# Patient Record
Sex: Male | Born: 1975 | Race: Black or African American | Hispanic: No | Marital: Single | Smoking: Former smoker
Health system: Southern US, Community
[De-identification: ages and names within clinical notes are randomized; demographics above are authoritative.]

## PROBLEM LIST (undated history)

## (undated) DIAGNOSIS — I10 Essential (primary) hypertension: Secondary | ICD-10-CM

## (undated) DIAGNOSIS — I471 Supraventricular tachycardia, unspecified: Secondary | ICD-10-CM

## (undated) DIAGNOSIS — K219 Gastro-esophageal reflux disease without esophagitis: Secondary | ICD-10-CM

## (undated) DIAGNOSIS — E785 Hyperlipidemia, unspecified: Secondary | ICD-10-CM

## (undated) DIAGNOSIS — T7840XA Allergy, unspecified, initial encounter: Secondary | ICD-10-CM

## (undated) DIAGNOSIS — N529 Male erectile dysfunction, unspecified: Secondary | ICD-10-CM

## (undated) HISTORY — PX: NO PAST SURGERIES: SHX2092

## (undated) HISTORY — DX: Allergy, unspecified, initial encounter: T78.40XA

## (undated) HISTORY — DX: Gastro-esophageal reflux disease without esophagitis: K21.9

## (undated) HISTORY — DX: Essential (primary) hypertension: I10

## (undated) HISTORY — DX: Hyperlipidemia, unspecified: E78.5

## (undated) HISTORY — DX: Male erectile dysfunction, unspecified: N52.9

---

## 2004-03-21 ENCOUNTER — Ambulatory Visit (HOSPITAL_COMMUNITY): Admission: RE | Admit: 2004-03-21 | Discharge: 2004-03-21 | Payer: Self-pay

## 2005-10-19 ENCOUNTER — Emergency Department (HOSPITAL_COMMUNITY): Admission: EM | Admit: 2005-10-19 | Discharge: 2005-10-19 | Payer: Self-pay | Admitting: Emergency Medicine

## 2007-08-09 ENCOUNTER — Emergency Department (HOSPITAL_COMMUNITY): Admission: EM | Admit: 2007-08-09 | Discharge: 2007-08-09 | Payer: Self-pay | Admitting: Emergency Medicine

## 2010-10-12 LAB — URINALYSIS, ROUTINE W REFLEX MICROSCOPIC
Glucose, UA: NEGATIVE
Ketones, ur: NEGATIVE
Leukocytes, UA: NEGATIVE
Nitrite: NEGATIVE
pH: 5.5

## 2010-10-12 LAB — DIFFERENTIAL
Basophils Absolute: 0.1
Lymphocytes Relative: 39
Lymphs Abs: 2.5
Monocytes Relative: 9
Neutro Abs: 3.1
Neutrophils Relative %: 50

## 2010-10-12 LAB — CBC
Hemoglobin: 14.6
MCHC: 33.3
RBC: 5.18
RDW: 13.2

## 2010-10-12 LAB — COMPREHENSIVE METABOLIC PANEL
AST: 18
Alkaline Phosphatase: 58
Chloride: 109
GFR calc non Af Amer: >60
Sodium: 139
Total Bilirubin: 0.9

## 2010-10-12 LAB — URINE MICROSCOPIC-ADD ON

## 2010-10-12 LAB — LIPASE, BLOOD: Lipase: 41

## 2010-10-12 LAB — RPR: RPR Ser Ql: NONREACTIVE

## 2012-11-17 ENCOUNTER — Encounter: Payer: Self-pay | Admitting: Family Medicine

## 2012-11-17 ENCOUNTER — Ambulatory Visit (INDEPENDENT_AMBULATORY_CARE_PROVIDER_SITE_OTHER): Payer: BC Managed Care – PPO | Admitting: Family Medicine

## 2012-11-17 VITALS — BP 112/77 | HR 92 | Temp 98.2°F | Wt 200.0 lb

## 2012-11-17 DIAGNOSIS — R21 Rash and other nonspecific skin eruption: Secondary | ICD-10-CM

## 2012-11-17 DIAGNOSIS — J029 Acute pharyngitis, unspecified: Secondary | ICD-10-CM

## 2012-11-17 LAB — POCT INFLUENZA A/B
Influenza A, POC: NEGATIVE
Influenza B, POC: NEGATIVE

## 2012-11-17 LAB — POCT RAPID STREP A (OFFICE): Rapid Strep A Screen: NEGATIVE

## 2012-11-17 MED ORDER — KETOCONAZOLE 2 % EX CREA: 1.0000 "application " | TOPICAL_CREAM | Freq: Two times a day (BID) | CUTANEOUS | Status: DC

## 2012-11-17 MED ORDER — FLUCONAZOLE 150 MG PO TABS: 150.0000 mg | ORAL_TABLET | Freq: Every day | ORAL | Status: DC

## 2012-11-17 MED ORDER — LEVOFLOXACIN 500 MG PO TABS: 500.0000 mg | ORAL_TABLET | Freq: Every day | ORAL | Status: DC

## 2012-11-17 NOTE — Addendum Note (Signed)
Addended by: Deatra Canter on: 11/17/2012 12:43 PM   Modules accepted: Orders

## 2012-11-17 NOTE — Addendum Note (Signed)
Addended by: Gwenith Daily on: 11/17/2012 12:23 PM   Modules accepted: Orders

## 2012-11-17 NOTE — Progress Notes (Signed)
  Subjective:    Patient ID: James Snyder, male    DOB: 1975/07/10, 37 y.o.   MRN: [id number]  HPI This 37 y.o. male presents for evaluation of rash on the left side of his face which went Away when he used his brothers cream but it is now coming back. He has sore throat Ear disomfort and uri sx's..   Review of Systems C/o uri sx's. No chest pain, SOB, HA, dizziness, vision change, N/V, diarrhea, constipation, dysuria, urinary urgency or frequency, myalgias, arthralgias or rash.     Objective:   Physical Exam Vital signs noted  Well developed well nourished male.  HEENT - Head atraumatic Normocephalic                Eyes - PERRLA, Conjuctiva - clear Sclera- Clear EOMI                Ears - EAC's Wnl TM's Wnl Gross Hearing WNL                Nose - Nares patent                 Throat - oropharanx injected. Respiratory - Lungs CTA bilateral Cardiac - RRR S1 and S2 without murmur GI - Abdomen soft Nontender and bowel sounds active x 4 Extremities - No edema. Neuro - Grossly intact.       Assessment & Plan:  Acute pharyngitis - Plan: POCT rapid strep A, POCT Influenza A/B WSWG's, tylneol and motrin otc as directed, push po fluids,rest.

## 2014-04-28 ENCOUNTER — Encounter: Payer: Self-pay | Admitting: Physician Assistant

## 2014-04-28 ENCOUNTER — Ambulatory Visit (INDEPENDENT_AMBULATORY_CARE_PROVIDER_SITE_OTHER): Payer: BLUE CROSS/BLUE SHIELD | Admitting: Physician Assistant

## 2014-04-28 VITALS — BP 153/103 | HR 101 | Temp 99.0°F | Ht 69.0 in | Wt 200.0 lb

## 2014-04-28 DIAGNOSIS — J0100 Acute maxillary sinusitis, unspecified: Secondary | ICD-10-CM | POA: Diagnosis not present

## 2014-04-28 DIAGNOSIS — I10 Essential (primary) hypertension: Secondary | ICD-10-CM | POA: Diagnosis not present

## 2014-04-28 DIAGNOSIS — J309 Allergic rhinitis, unspecified: Secondary | ICD-10-CM

## 2014-04-28 DIAGNOSIS — K219 Gastro-esophageal reflux disease without esophagitis: Secondary | ICD-10-CM

## 2014-04-28 DIAGNOSIS — B9689 Other specified bacterial agents as the cause of diseases classified elsewhere: Secondary | ICD-10-CM

## 2014-04-28 DIAGNOSIS — J302 Other seasonal allergic rhinitis: Secondary | ICD-10-CM

## 2014-04-28 DIAGNOSIS — J019 Acute sinusitis, unspecified: Principal | ICD-10-CM

## 2014-04-28 DIAGNOSIS — Z8639 Personal history of other endocrine, nutritional and metabolic disease: Secondary | ICD-10-CM

## 2014-04-28 LAB — POCT CBC
GRANULOCYTE PERCENT: 65.6 % (ref 37–80)
HEMATOCRIT: 48.5 % (ref 43.5–53.7)
Hemoglobin: 14.9 g/dL (ref 14.1–18.1)
LYMPH, POC: 3 (ref 0.6–3.4)
MCH: 25.4 pg — AB (ref 27–31.2)
MCHC: 30.8 g/dL — AB (ref 31.8–35.4)
MCV: 82.7 fL (ref 80–97)
MPV: 7.2 fL (ref 0–99.8)
PLATELET COUNT, POC: 313 10*3/uL (ref 142–424)
POC Granulocyte: 6.1 (ref 2–6.9)
POC LYMPH PERCENT: 32.7 %L (ref 10–50)
RBC: 5.87 M/uL (ref 4.69–6.13)
RDW, POC: 14.2 %
WBC: 9.3 10*3/uL (ref 4.6–10.2)

## 2014-04-28 MED ORDER — PRILOSEC 20 MG PO CPDR
20.0000 mg | DELAYED_RELEASE_CAPSULE | Freq: Every day | ORAL | Status: DC
Start: 2014-04-28 — End: 2014-04-28

## 2014-04-28 MED ORDER — CETIRIZINE HCL 10 MG PO TABS: 10.0000 mg | ORAL_TABLET | Freq: Every day | ORAL | Status: DC

## 2014-04-28 MED ORDER — OMEPRAZOLE 40 MG PO CPDR: 40.0000 mg | DELAYED_RELEASE_CAPSULE | Freq: Every day | ORAL | Status: DC

## 2014-04-28 MED ORDER — AZITHROMYCIN 250 MG PO TABS: ORAL_TABLET | ORAL | Status: DC

## 2014-04-28 MED ORDER — FLUTICASONE PROPIONATE 50 MCG/ACT NA SUSP: 2.0000 | Freq: Every day | NASAL | Status: DC

## 2014-04-28 NOTE — Progress Notes (Signed)
   Subjective:    Patient ID: James Snyder, male    DOB: 01/14/1976, 39 y.o.   MRN: [id number]  HPI 39 y/o male presents with c/o sore throat, cough, nasal congestion x 2 days. Cough worse at night. Has not tried any medications.     Review of Systems  Constitutional: Positive for chills and diaphoresis. Negative for fever.  HENT: Positive for congestion (nasal ), postnasal drip, rhinorrhea, sinus pressure, sneezing and sore throat.   Eyes: Positive for itching.  Respiratory: Positive for cough (productive ). Negative for chest tightness and wheezing.        Objective:   Physical Exam  Constitutional: He appears well-developed and well-nourished. No distress.  HENT:  Head: Normocephalic.  Right Ear: External ear normal.  Left Ear: External ear normal.  Mouth/Throat: Oropharynx is clear and moist. No oropharyngeal exudate.  TTP bilateral maxillary sinuses  Neck: Normal range of motion. Neck supple.  Cardiovascular: Normal rate, regular rhythm and normal heart sounds.  Exam reveals no gallop and no friction rub.   No murmur heard. Hypertensive Mild tachycardic  Pulmonary/Chest: Effort normal and breath sounds normal. No respiratory distress. He has no wheezes. He has no rales. He exhibits no tenderness.  Skin: He is not diaphoretic.  Nursing note and vitals reviewed.         Assessment & Plan:  1. Acute bacterial sinusitis  - azithromycin (ZITHROMAX) 250 MG tablet; Take 2 pills on day 1. Then 1 pill on day 2-5  Dispense: 6 tablet; Refill: 0     2. Gastroesophageal reflux disease without esophagitis  - omeprazole (PRILOSEC) 40 MG capsule; Take 1 capsule (40 mg total) by mouth daily.  Dispense: 30 capsule; Refill: 3  3. Allergic rhinitis, unspecified allergic rhinitis type  - fluticasone (FLONASE) 50 MCG/ACT nasal spray; Place 2 sprays into both nostrils daily.  Dispense: 16 g; Refill: 6 - cetirizine (ZYRTEC) 10 MG tablet; Take 1 tablet (10 mg total) by mouth daily.   Dispense: 30 tablet; Refill: 11  4. History of hyperlipidemia  - Lipid Panel  5. Essential hypertension  - Basic Metabolic Panel - POCT CBC     Continue all meds Labs pending Health Maintenance reviewed Diet and exercise encouraged

## 2014-04-29 ENCOUNTER — Telehealth: Payer: Self-pay | Admitting: *Deleted

## 2014-04-29 DIAGNOSIS — J0100 Acute maxillary sinusitis, unspecified: Secondary | ICD-10-CM | POA: Insufficient documentation

## 2014-04-29 LAB — LIPID PANEL
CHOL/HDL RATIO: 6.8 ratio — AB (ref 0.0–5.0)
Cholesterol, Total: 286 mg/dL — ABNORMAL HIGH (ref 100–199)
HDL: 42 mg/dL (ref >39)
LDL Calculated: 213 mg/dL — ABNORMAL HIGH (ref 0–99)
Triglycerides: 154 mg/dL — ABNORMAL HIGH (ref 0–149)
VLDL Cholesterol Cal: 31 mg/dL (ref 5–40)

## 2014-04-29 LAB — BASIC METABOLIC PANEL
BUN/Creatinine Ratio: 9 (ref 9–20)
BUN: 8 mg/dL (ref 6–24)
CHLORIDE: 101 mmol/L (ref 97–108)
CO2: 22 mmol/L (ref 18–29)
CREATININE: 0.94 mg/dL (ref 0.76–1.27)
Calcium: 9.5 mg/dL (ref 8.7–10.2)
GFR, EST AFRICAN AMERICAN: 117 mL/min/{1.73_m2} (ref >59)
GFR, EST NON AFRICAN AMERICAN: 101 mL/min/{1.73_m2} (ref >59)
GLUCOSE: 95 mg/dL (ref 65–99)
POTASSIUM: 4.6 mmol/L (ref 3.5–5.2)
SODIUM: 139 mmol/L (ref 134–144)

## 2014-04-29 NOTE — Telephone Encounter (Signed)
-----   Message from Chelsea Primusiffany A Gann, PA-C sent at 04/29/2014 11:34 AM EDT ----- Have patient f/u in 2 weeks to discuss labs, starting medication and recheck sinusitis.

## 2014-04-29 NOTE — Telephone Encounter (Signed)
ttc pt regarding test results, couldn't leave message.

## 2014-05-25 ENCOUNTER — Ambulatory Visit (INDEPENDENT_AMBULATORY_CARE_PROVIDER_SITE_OTHER): Payer: BLUE CROSS/BLUE SHIELD | Admitting: Physician Assistant

## 2014-05-25 ENCOUNTER — Encounter: Payer: Self-pay | Admitting: Physician Assistant

## 2014-05-25 VITALS — BP 137/91 | HR 85 | Temp 97.7°F | Ht 69.0 in | Wt 205.0 lb

## 2014-05-25 DIAGNOSIS — R350 Frequency of micturition: Secondary | ICD-10-CM | POA: Diagnosis not present

## 2014-05-25 DIAGNOSIS — R42 Dizziness and giddiness: Secondary | ICD-10-CM

## 2014-05-25 DIAGNOSIS — I10 Essential (primary) hypertension: Secondary | ICD-10-CM

## 2014-05-25 DIAGNOSIS — L749 Eccrine sweat disorder, unspecified: Secondary | ICD-10-CM

## 2014-05-25 DIAGNOSIS — E785 Hyperlipidemia, unspecified: Secondary | ICD-10-CM | POA: Diagnosis not present

## 2014-05-25 LAB — POCT UA - MICROSCOPIC ONLY
BACTERIA, U MICROSCOPIC: NEGATIVE
CRYSTALS, UR, HPF, POC: NEGATIVE
Casts, Ur, LPF, POC: NEGATIVE
EPITHELIAL CELLS, URINE PER MICROSCOPY: NEGATIVE
Yeast, UA: NEGATIVE

## 2014-05-25 LAB — POCT URINALYSIS DIPSTICK
Glucose, UA: NEGATIVE
KETONES UA: NEGATIVE
Leukocytes, UA: NEGATIVE
Nitrite, UA: NEGATIVE
SPEC GRAV UA: 1.025
Urobilinogen, UA: NEGATIVE
pH, UA: 6

## 2014-05-25 MED ORDER — SIMVASTATIN 20 MG PO TABS: 20.0000 mg | ORAL_TABLET | Freq: Every day | ORAL | Status: DC

## 2014-05-25 MED ORDER — LISINOPRIL-HYDROCHLOROTHIAZIDE 10-12.5 MG PO TABS: 1.0000 | ORAL_TABLET | Freq: Every day | ORAL | Status: DC

## 2014-05-25 NOTE — Progress Notes (Signed)
   Subjective:    Patient ID: James Snyder, male    DOB: 01/12/1976, 39 y.o.   MRN: [id number]  HPI 39 y/o male presents for review of labs that were checked at his visit in April. He has a history of hyperlipidemia but stopped taking his cholesterol medication 8 years ago. He also states that over the past two years he has had episodes of sweating when he's just sitting. Not always related to exercise. Has also had episodes of central chest pain, which is relieved by Pepsi and belching. Intermittent episodes of dizziness that started yesterday.     Review of Systems  Constitutional: Positive for diaphoresis. Negative for chills and fatigue.  Eyes: Negative.   Respiratory: Negative for shortness of breath.   Cardiovascular: Positive for chest pain (occasional, relieved by drinking Pepsi and belching ). Negative for palpitations and leg swelling.  Gastrointestinal: Negative.   Endocrine: Positive for heat intolerance and polyuria.  Genitourinary: Positive for frequency.  Neurological: Positive for dizziness and headaches.       Objective:   Physical Exam  Constitutional: He is oriented to person, place, and time. He appears well-developed and well-nourished. No distress.  HENT:  Head: Normocephalic and atraumatic.  Right Ear: External ear normal.  Left Ear: External ear normal.  Cardiovascular: Normal rate, regular rhythm and normal heart sounds.  Exam reveals no gallop and no friction rub.   No murmur heard. Pulmonary/Chest: Effort normal and breath sounds normal.  Abdominal: Soft. Bowel sounds are normal.  Neurological: He is alert and oriented to person, place, and time.  Skin: He is not diaphoretic.  Psychiatric: He has a normal mood and affect. His behavior is normal. Judgment and thought content normal.  Nursing note and vitals reviewed.         Assessment & Plan:  1. Sweating abnormality  - Testosterone,Free and Total - Thyroid Panel With TSH  2. Dizziness and  giddiness  - EKG 12-Lead  3. Urine frequency  - POCT UA - Microscopic Only - POCT urinalysis dipstick  4. Hyperlipidemia  - simvastatin (ZOCOR) 20 MG tablet; Take 1 tablet (20 mg total) by mouth at bedtime.  Dispense: 30 tablet; Refill: 5  5. Essential hypertension  - lisinopril-hydrochlorothiazide (PRINZIDE,ZESTORETIC) 10-12.5 MG per tablet; Take 1 tablet by mouth daily.  Dispense: 30 tablet; Refill: 0   Continue all meds Labs pending Health Maintenance reviewed Diet and exercise encouraged RTO 3 months   Clemens Lachman A. Chauncey ReadingGann PA-C

## 2014-05-28 LAB — TESTOSTERONE,FREE AND TOTAL
Testosterone, Free: 8.9 pg/mL (ref 6.8–21.5)
Testosterone: 451 ng/dL (ref 348–1197)

## 2014-05-28 LAB — THYROID PANEL WITH TSH
Free Thyroxine Index: 1.8 (ref 1.2–4.9)
T3 Uptake Ratio: 33 % (ref 24–39)
T4, Total: 5.6 ug/dL (ref 4.5–12.0)
TSH: 0.733 u[IU]/mL (ref 0.450–4.500)

## 2014-06-24 ENCOUNTER — Other Ambulatory Visit: Payer: Self-pay | Admitting: Physician Assistant

## 2014-06-24 ENCOUNTER — Telehealth: Payer: Self-pay | Admitting: Physician Assistant

## 2014-06-24 NOTE — Telephone Encounter (Signed)
LMTCB

## 2014-06-24 NOTE — Telephone Encounter (Signed)
He has not been seen for this in our office. He needs to set up appt to discuss. Viagra and Cialis can both cause headaches. Undray Allman A. Chauncey Reading PA-C

## 2014-06-24 NOTE — Telephone Encounter (Signed)
Wants to know if you will rx cialis to use PRN. He has heard that Viagra causes Headaches - do you know anything about this?

## 2014-06-28 ENCOUNTER — Ambulatory Visit (INDEPENDENT_AMBULATORY_CARE_PROVIDER_SITE_OTHER): Payer: BLUE CROSS/BLUE SHIELD | Admitting: Physician Assistant

## 2014-06-28 ENCOUNTER — Encounter (INDEPENDENT_AMBULATORY_CARE_PROVIDER_SITE_OTHER): Payer: Self-pay

## 2014-06-28 ENCOUNTER — Encounter: Payer: Self-pay | Admitting: Physician Assistant

## 2014-06-28 VITALS — BP 120/83 | HR 102 | Temp 97.6°F | Ht 69.0 in | Wt 198.0 lb

## 2014-06-28 DIAGNOSIS — N529 Male erectile dysfunction, unspecified: Secondary | ICD-10-CM

## 2014-06-28 MED ORDER — SILDENAFIL CITRATE 50 MG PO TABS: 50.0000 mg | ORAL_TABLET | Freq: Every day | ORAL | Status: DC | PRN

## 2014-06-28 NOTE — Progress Notes (Signed)
   Subjective:    Patient ID: James Snyder, male    DOB: 1975-09-07, 38 y.o.   MRN: [id number]  HPI 39 y/o male with comorbid HTN and hyperlipidemia presents for discussion of ED. He states that he can get an erection but has problems keeping it and this is occuring more frequently. He has tried Viagra in the past with success.     Review of Systems  Endocrine: Negative.   Genitourinary:       Worsening Inability to maintain an erection  All other systems reviewed and are negative.      Objective:   Physical Exam  Constitutional: He is oriented to person, place, and time. He appears well-developed and well-nourished. No distress.  Cardiovascular: Normal rate, regular rhythm and normal heart sounds.  Exam reveals no gallop and no friction rub.   No murmur heard. Pulmonary/Chest: Effort normal and breath sounds normal.  Neurological: He is alert and oriented to person, place, and time.  Skin: He is not diaphoretic.  Psychiatric: He has a normal mood and affect. His behavior is normal. Judgment and thought content normal.  Nursing note and vitals reviewed.         Assessment & Plan:  1. Erectile dysfunction, unspecified erectile dysfunction type  - sildenafil (VIAGRA) 50 MG tablet; Take 1 tablet (50 mg total) by mouth daily as needed for erectile dysfunction.  Dispense: 15 tablet; Refill: 0   RTC prn   Tiffany A. Chauncey Reading PA-C

## 2014-06-28 NOTE — Patient Instructions (Signed)
Sildenafil tablets (Viagra) What is this medicine? SILDENAFIL (sil DEN a fil) is used to treat erection problems in men. This medicine may be used for other purposes; ask your health care provider or pharmacist if you have questions. COMMON BRAND NAME(S): Viagra What should I tell my health care provider before I take this medicine? They need to know if you have any of these conditions: -bleeding disorders -eye or vision problems, including a rare inherited eye disease called retinitis pigmentosa -anatomical deformation of the penis, Peyronie's disease, or history of priapism (painful and prolonged erection) -heart disease, angina, a history of heart attack, irregular heart beats, or other heart problems -high or low blood pressure -history of blood diseases, like sickle cell anemia or leukemia -history of stomach bleeding -kidney disease -liver disease -stroke -an unusual or allergic reaction to sildenafil, other medicines, foods, dyes, or preservatives -pregnant or trying to get pregnant -breast-feeding How should I use this medicine? Take this medicine by mouth with a glass of water. Follow the directions on the prescription label. The dose is usually taken 1 hour before sexual activity. You should not take the dose more than once per day. Do not take your medicine more often than directed. Talk to your pediatrician regarding the use of this medicine in children. This medicine is not used in children for this condition. Overdosage: If you think you have taken too much of this medicine contact a poison control center or emergency room at once. NOTE: This medicine is only for you. Do not share this medicine with others. What if I miss a dose? This does not apply. Do not take double or extra doses. What may interact with this medicine? Do not take this medicine with any of the following medications: -cisapride -methscopolamine nitrate -nitrates like amyl nitrite, isosorbide dinitrate,  isosorbide mononitrate, nitroglycerin -nitroprusside -other medicines for erectile dysfunction like avanafil, tadalafil, vardenafil -other sildenafil products (Revatio) This medicine may also interact with the following medications: -certain drugs for high blood pressure -certain drugs for the treatment of HIV infection or AIDS -certain drugs used for fungal or yeast infections, like fluconazole, itraconazole, ketoconazole, and voriconazole -cimetidine -erythromycin -rifampin This list may not describe all possible interactions. Give your health care provider a list of all the medicines, herbs, non-prescription drugs, or dietary supplements you use. Also tell them if you smoke, drink alcohol, or use illegal drugs. Some items may interact with your medicine. What should I watch for while using this medicine? If you notice any changes in your vision while taking this drug, call your doctor or health care professional as soon as possible. Stop using this medicine and call your health care provider right away if you have a loss of sight in one or both eyes. Contact your doctor or health care professional right away if you have an erection that lasts longer than 4 hours or if it becomes painful. This may be a sign of a serious problem and must be treated right away to prevent permanent damage. If you experience symptoms of nausea, dizziness, chest pain or arm pain upon initiation of sexual activity after taking this medicine, you should refrain from further activity and call your doctor or health care professional as soon as possible. Do not drink alcohol to excess (examples, 5 glasses of wine or 5 shots of whiskey) when taking this medicine. When taken in excess, alcohol can increase your chances of getting a headache or getting dizzy, increasing your heart rate or lowering your blood pressure.  Using this medicine does not protect you or your partner against HIV infection (the virus that causes AIDS) or  other sexually transmitted diseases. What side effects may I notice from receiving this medicine? Side effects that you should report to your doctor or health care professional as soon as possible: -allergic reactions like skin rash, itching or hives, swelling of the face, lips, or tongue -breathing problems -changes in hearing -changes in vision -chest pain -fast, irregular heartbeat -prolonged or painful erection -seizures Side effects that usually do not require medical attention (report to your doctor or health care professional if they continue or are bothersome): -back pain -dizziness -flushing -headache -indigestion -muscle aches -nausea -stuffy or runny nose This list may not describe all possible side effects. Call your doctor for medical advice about side effects. You may report side effects to FDA at 1-800-FDA-1088. Where should I keep my medicine? Keep out of reach of children. Store at room temperature between 15 and 30 degrees C (59 and 86 degrees F). Throw away any unused medicine after the expiration date. NOTE: This sheet is a summary. It may not cover all possible information. If you have questions about this medicine, talk to your doctor, pharmacist, or health care provider.  2015, Elsevier/Gold Standard. (2012-01-01 12:43:54) Erectile Dysfunction Erectile dysfunction is the inability to get or sustain a good enough erection to have sexual intercourse. Erectile dysfunction may involve:  Inability to get an erection.  Lack of enough hardness to allow penetration.  Loss of the erection before sex is finished.  Premature ejaculation. CAUSES  Certain drugs, such as:  Pain relievers.  Antihistamines.  Antidepressants.  Blood pressure medicines.  Water pills (diuretics).  Ulcer medicines.  Muscle relaxants.  Illegal drugs.  Excessive drinking.  Psychological causes, such as:  Anxiety.  Depression.  Sadness.  Exhaustion.  Performance  fear.  Stress.  Physical causes, such as:  Artery problems. This may include diabetes, smoking, liver disease, or atherosclerosis.  High blood pressure.  Hormonal problems, such as low testosterone.  Obesity.  Nerve problems. This may include back or pelvic injuries, diabetes mellitus, multiple sclerosis, or Parkinson disease. SYMPTOMS  Inability to get an erection.  Lack of enough hardness to allow penetration.  Loss of the erection before sex is finished.  Premature ejaculation.  Normal erections at some times, but with frequent unsatisfactory episodes.  Orgasms that are not satisfactory in sensation or frequency.  Low sexual satisfaction in either partner because of erection problems.  A curved penis occurring with erection. The curve may cause pain or may be too curved to allow for intercourse.  Never having nighttime erections. DIAGNOSIS Your caregiver can often diagnose this condition by:  Performing a physical exam to find other diseases or specific problems with the penis.  Asking you detailed questions about the problem.  Performing blood tests to check for diabetes mellitus or to measure hormone levels.  Performing urine tests to find other underlying health conditions.  Performing an ultrasound exam to check for scarring.  Performing a test to check blood flow to the penis.  Doing a sleep study at home to measure nighttime erections. TREATMENT   You may be prescribed medicines by mouth.  You may be given medicine injections into the penis.  You may be prescribed a vacuum pump with a ring.  Penile implant surgery may be performed. You may receive:  An inflatable implant.  A semirigid implant.  Blood vessel surgery may be performed. HOME CARE INSTRUCTIONS  If you  are prescribed oral medicine, you should take the medicine as prescribed. Do not increase the dosage without first discussing it with your physician.  If you are using  self-injections, be careful to avoid any veins that are on the surface of the penis. Apply pressure to the injection site for 5 minutes.  If you are using a vacuum pump, make sure you have read the instructions before using it. Discuss any questions with your physician before taking the pump home. SEEK MEDICAL CARE IF:  You experience pain that is not responsive to the pain medicine you have been prescribed.  You experience nausea or vomiting. SEEK IMMEDIATE MEDICAL CARE IF:   When taking oral or injectable medications, you experience an erection that lasts longer than 4 hours. If your physician is unavailable, go to the nearest emergency room for evaluation. An erection that lasts much longer than 4 hours can result in permanent damage to your penis.  You have pain that is severe.  You develop redness, severe pain, or severe swelling of your penis.  You have redness spreading up into your groin or lower abdomen.  You are unable to pass your urine. Document Released: 12/29/1999 Document Revised: 09/02/2012 Document Reviewed: 06/04/2012 [facility] Patient Information 2015 [place], [place]. This information is not intended to replace advice given to you by your health care provider. Make sure you discuss any questions you have with your health care provider.

## 2014-08-23 ENCOUNTER — Other Ambulatory Visit: Payer: Self-pay | Admitting: Physician Assistant

## 2014-10-24 ENCOUNTER — Other Ambulatory Visit: Payer: Self-pay | Admitting: Physician Assistant

## 2014-10-25 NOTE — Telephone Encounter (Signed)
Last seen 06/28/14 Tiffany  If approved route to nurse to call into [facility]

## 2014-10-26 NOTE — Telephone Encounter (Signed)
Please review and advise.

## 2014-11-19 ENCOUNTER — Other Ambulatory Visit: Payer: Self-pay | Admitting: Physician Assistant

## 2014-11-22 ENCOUNTER — Telehealth: Payer: Self-pay | Admitting: Physician Assistant

## 2014-11-22 ENCOUNTER — Other Ambulatory Visit: Payer: Self-pay | Admitting: *Deleted

## 2014-11-22 ENCOUNTER — Other Ambulatory Visit: Payer: Self-pay | Admitting: Physician Assistant

## 2014-11-22 DIAGNOSIS — J309 Allergic rhinitis, unspecified: Secondary | ICD-10-CM

## 2014-11-22 MED ORDER — SILDENAFIL CITRATE 50 MG PO TABS: 50.0000 mg | ORAL_TABLET | ORAL | Status: DC | PRN

## 2014-11-22 MED ORDER — FLUTICASONE PROPIONATE 50 MCG/ACT NA SUSP: 2.0000 | Freq: Every day | NASAL | Status: DC

## 2014-11-22 MED ORDER — LISINOPRIL-HYDROCHLOROTHIAZIDE 10-12.5 MG PO TABS: 1.0000 | ORAL_TABLET | Freq: Every day | ORAL | Status: DC

## 2014-11-22 MED ORDER — CETIRIZINE HCL 10 MG PO TABS: 10.0000 mg | ORAL_TABLET | Freq: Every day | ORAL | Status: DC

## 2014-11-22 NOTE — Telephone Encounter (Signed)
Patient aware and will call to set up appointment.

## 2014-11-22 NOTE — Telephone Encounter (Signed)
I sent through all but the Zocor. He'll need to be seen for that one.

## 2014-11-22 NOTE — Telephone Encounter (Signed)
Only saw [place] and [address]

## 2014-11-23 NOTE — Telephone Encounter (Signed)
Only saw Tiffany in May

## 2014-12-21 ENCOUNTER — Encounter: Payer: Self-pay | Admitting: Pediatrics

## 2014-12-21 ENCOUNTER — Ambulatory Visit (INDEPENDENT_AMBULATORY_CARE_PROVIDER_SITE_OTHER): Payer: BLUE CROSS/BLUE SHIELD | Admitting: Pediatrics

## 2014-12-21 VITALS — BP 125/90 | HR 88 | Temp 97.3°F | Ht 69.0 in | Wt 200.0 lb

## 2014-12-21 DIAGNOSIS — Z8639 Personal history of other endocrine, nutritional and metabolic disease: Secondary | ICD-10-CM | POA: Diagnosis not present

## 2014-12-21 DIAGNOSIS — Z1389 Encounter for screening for other disorder: Secondary | ICD-10-CM | POA: Diagnosis not present

## 2014-12-21 DIAGNOSIS — K219 Gastro-esophageal reflux disease without esophagitis: Secondary | ICD-10-CM | POA: Diagnosis not present

## 2014-12-21 DIAGNOSIS — N529 Male erectile dysfunction, unspecified: Secondary | ICD-10-CM | POA: Diagnosis not present

## 2014-12-21 DIAGNOSIS — Z23 Encounter for immunization: Secondary | ICD-10-CM

## 2014-12-21 DIAGNOSIS — I10 Essential (primary) hypertension: Secondary | ICD-10-CM | POA: Diagnosis not present

## 2014-12-21 DIAGNOSIS — Z6829 Body mass index (BMI) 29.0-29.9, adult: Secondary | ICD-10-CM | POA: Diagnosis not present

## 2014-12-21 DIAGNOSIS — Z1331 Encounter for screening for depression: Secondary | ICD-10-CM

## 2014-12-21 MED ORDER — OMEPRAZOLE 40 MG PO CPDR: DELAYED_RELEASE_CAPSULE | ORAL | Status: DC

## 2014-12-21 MED ORDER — SILDENAFIL CITRATE 100 MG PO TABS: ORAL_TABLET | ORAL | Status: DC

## 2014-12-21 MED ORDER — LISINOPRIL-HYDROCHLOROTHIAZIDE 10-12.5 MG PO TABS: 1.0000 | ORAL_TABLET | Freq: Every day | ORAL | Status: DC

## 2014-12-21 NOTE — Progress Notes (Signed)
Subjective:    Patient ID: James Snyder, male    DOB: 1975/06/12, 39 y.o.   MRN: [id number]  CC: Medication Refill   HPI: James Snyder is a 39 y.o. male presenting for Medication Refill  Lost some weight recently, cutting back on portions Maintaining an erection is difficult, able to start just fine. viagra helps but 28m doesn't last long enough  Headaches have been better since being on the BP medicine Cutting back on salt No chest pain or SOB Trying to get some exercise in regularly  GER symptoms well controlled as long as he continues to take omeprazole.   Depression screen PHQ 2/9 12/21/2014  Decreased Interest 0  Down, Depressed, Hopeless 0  PHQ - 2 Score 0     Relevant past medical, surgical, family and social history reviewed and updated as indicated. Interim medical history since our last visit reviewed. Allergies and medications reviewed and updated.    ROS: Per HPI unless specifically indicated above  History  Smoking status  . Light Tobacco Smoker  . Types: Cigars  Smokeless tobacco  . Not on file    Comment: every once in a while    Past Medical History Patient Active Problem List   Diagnosis Date Noted  . Erectile dysfunction 12/21/2014  . H/O hyperlipidemia 04/28/2014  . Essential hypertension 04/28/2014  . Acid reflux 04/28/2014  . Seasonal allergies 04/28/2014    Current Outpatient Prescriptions  Medication Sig Dispense Refill  . cetirizine (ZYRTEC) 10 MG tablet Take 1 tablet (10 mg total) by mouth daily. 30 tablet 11  . fluticasone (FLONASE) 50 MCG/ACT nasal spray Place 2 sprays into both nostrils daily. 16 g 6  . lisinopril-hydrochlorothiazide (PRINZIDE,ZESTORETIC) 10-12.5 MG tablet Take 1 tablet by mouth daily. 30 tablet 6  . omeprazole (PRILOSEC) 40 MG capsule TAKE 1 CAPSULE (40 MG TOTAL) BY MOUTH DAILY. 30 capsule 5  . sildenafil (VIAGRA) 100 MG tablet TAKE 1 TABLET (50 MG TOTAL) BY MOUTH DAILY AS NEEDED FOR ERECTILE  DYSFUNCTION. 15 tablet 2  . pravastatin (PRAVACHOL) 40 MG tablet Take 1 tablet (40 mg total) by mouth daily. 90 tablet 3   No current facility-administered medications for this visit.       Objective:    BP 125/90 mmHg  Pulse 88  Temp(Src) 97.3 F (36.3 C) (Oral)  Ht 5' 9"  (1.753 m)  Wt 200 lb (90.719 kg)  BMI 29.52 kg/m2  Wt Readings from Last 3 Encounters:  12/21/14 200 lb (90.719 kg)  06/28/14 198 lb (89.812 kg)  05/25/14 205 lb (92.987 kg)     Gen: NAD, alert, cooperative with exam, NCAT EYES: EOMI, no scleral injection or icterus ENT:  TMs pearly gray b/l, OP without erythema LYMPH: no cervical LAD CV: NRRR, normal S1/S2, no murmur, distal pulses 2+ b/l Resp: CTABL, no wheezes, normal WOB Abd: +BS, soft, NTND. no guarding or organomegaly Ext: No edema, warm Neuro: Alert and oriented, strength equal b/l UE and LE, coordination grossly normal MSK: normal muscle bulk     Assessment & Plan:    BMikiewas seen today for medication refill and multiple med problem f/u.  Diagnoses and all orders for this visit:  Essential hypertension OK control today, 125/90. Continue current medications. Check labs today. -     BMP8+EGFR -     lisinopril-hydrochlorothiazide (PRINZIDE,ZESTORETIC) 10-12.5 MG tablet; Take 1 tablet by mouth daily.  Gastroesophageal reflux disease, esophagitis presence not specified well controlled on below, continue. Has symptoms when  he tries to come off. Will continue to try to lose weight. -     omeprazole (PRILOSEC) 40 MG capsule; TAKE 1 CAPSULE (40 MG TOTAL) BY MOUTH DAILY.  H/O hyperlipidemia Switch to pravastatin. -     Lipid panel  Erectile dysfunction, unspecified erectile dysfunction type Increase to 129m, can try half or whole tab if needed. 580mdidn't always work.  -     sildenafil (VIAGRA) 100 MG tablet; TAKE 1 TABLET BY MOUTH DAILY AS NEEDED FOR ERECTILE DYSFUNCTION.  Encounter for immunization  Other orders -     Flu Vaccine QUAD  36+ mos IM  BMI 29 Continue portion control, lifestyle changes, diet and exercise  Depression screen negative  Follow up plan: Return in about 6 months (around 06/21/2015).  CaAssunta FoundMD [facility] 12/21/2014, 9:47 AM

## 2014-12-21 NOTE — Patient Instructions (Signed)
Debrox drops

## 2014-12-22 ENCOUNTER — Telehealth: Payer: Self-pay | Admitting: Pediatrics

## 2014-12-22 DIAGNOSIS — E785 Hyperlipidemia, unspecified: Secondary | ICD-10-CM

## 2014-12-22 LAB — LIPID PANEL
CHOLESTEROL TOTAL: 204 mg/dL — AB (ref 100–199)
Chol/HDL Ratio: 3.8 ratio units (ref 0.0–5.0)
HDL: 53 mg/dL (ref >39)
LDL CALC: 128 mg/dL — AB (ref 0–99)
TRIGLYCERIDES: 113 mg/dL (ref 0–149)
VLDL CHOLESTEROL CAL: 23 mg/dL (ref 5–40)

## 2014-12-22 LAB — BMP8+EGFR
BUN/Creatinine Ratio: 11 (ref 9–20)
BUN: 11 mg/dL (ref 6–24)
CALCIUM: 9.9 mg/dL (ref 8.7–10.2)
CO2: 24 mmol/L (ref 18–29)
Chloride: 97 mmol/L (ref 97–106)
Creatinine, Ser: 1.02 mg/dL (ref 0.76–1.27)
GFR, EST AFRICAN AMERICAN: 106 mL/min/{1.73_m2} (ref >59)
GFR, EST NON AFRICAN AMERICAN: 92 mL/min/{1.73_m2} (ref >59)
Glucose: 101 mg/dL — ABNORMAL HIGH (ref 65–99)
POTASSIUM: 4.6 mmol/L (ref 3.5–5.2)
SODIUM: 139 mmol/L (ref 136–144)

## 2014-12-22 MED ORDER — PRAVASTATIN SODIUM 40 MG PO TABS: 40.0000 mg | ORAL_TABLET | Freq: Every day | ORAL | Status: DC

## 2014-12-22 NOTE — Telephone Encounter (Signed)
Please let pt know--labs from yesterday looked good, cholesterol level much better than where it was at last check. Switched his medicine from simvastatin to pravastatin, is a more effctive medicine. [facility]

## 2014-12-24 DIAGNOSIS — Z6829 Body mass index (BMI) 29.0-29.9, adult: Secondary | ICD-10-CM | POA: Insufficient documentation

## 2014-12-28 NOTE — Telephone Encounter (Signed)
Patient aware.

## 2015-01-19 ENCOUNTER — Telehealth: Payer: Self-pay | Admitting: Pediatrics

## 2015-01-19 NOTE — Telephone Encounter (Signed)
Patient aware rx has been sent over.

## 2015-03-31 ENCOUNTER — Encounter: Payer: Self-pay | Admitting: Family Medicine

## 2015-03-31 ENCOUNTER — Ambulatory Visit (INDEPENDENT_AMBULATORY_CARE_PROVIDER_SITE_OTHER): Payer: BLUE CROSS/BLUE SHIELD | Admitting: Family Medicine

## 2015-03-31 VITALS — BP 111/72 | HR 98 | Temp 97.1°F | Ht 69.0 in | Wt 200.0 lb

## 2015-03-31 DIAGNOSIS — J029 Acute pharyngitis, unspecified: Secondary | ICD-10-CM

## 2015-03-31 DIAGNOSIS — J209 Acute bronchitis, unspecified: Secondary | ICD-10-CM | POA: Diagnosis not present

## 2015-03-31 DIAGNOSIS — R6883 Chills (without fever): Secondary | ICD-10-CM

## 2015-03-31 LAB — VERITOR FLU A/B WAIVED
INFLUENZA A: NEGATIVE
INFLUENZA B: NEGATIVE

## 2015-03-31 LAB — RAPID STREP SCREEN (MED CTR MEBANE ONLY): STREP GP A AG, IA W/REFLEX: NEGATIVE

## 2015-03-31 LAB — CULTURE, GROUP A STREP

## 2015-03-31 MED ORDER — DOXYCYCLINE HYCLATE 100 MG PO TABS: 100.0000 mg | ORAL_TABLET | Freq: Two times a day (BID) | ORAL | Status: DC

## 2015-03-31 NOTE — Progress Notes (Signed)
BP 111/72 mmHg  Pulse 98  Temp(Src) 97.1 F (36.2 C) (Oral)  Ht 5' 9"  (1.753 m)  Wt 200 lb (90.719 kg)  BMI 29.52 kg/m2   Subjective:    Patient ID: James Snyder, male    DOB: Mar 16, 1975, 40 y.o.   MRN: [id number]  HPI: James Snyder is a 40 y.o. male presenting on 03/31/2015 for Headache, sinus congestion, sore throat, chills, cough and Left ear pain   HPI Left ear pain and sinus congestion and postnasal drainage and chest congestion Patient is coming in today because he has been having left ear pain and sinus congestion and chest congestion and postnasal drainage is been going on for the past 2 days. He has been using a regular allergy pill that he usually has which is Zyrtec and he has Flonase but he has not been using it many is some Alka-Seltzer's sinus and cold without much success. He denies any fevers or chills or shortness of breath or wheezing. His cough is reductive of white to yellow sputum and is worse early in the morning when he wakes up. He feels like there is pressure and pain in his left ear.  Relevant past medical, surgical, family and social history reviewed and updated as indicated. Interim medical history since our last visit reviewed. Allergies and medications reviewed and updated.  Review of Systems  Constitutional: Negative for fever and chills.  HENT: Positive for congestion, postnasal drip, rhinorrhea, sinus pressure, sneezing and sore throat. Negative for ear discharge, ear pain and voice change.   Eyes: Negative for pain, discharge, redness and visual disturbance.  Respiratory: Positive for cough. Negative for chest tightness, shortness of breath and wheezing.   Cardiovascular: Negative for chest pain and leg swelling.  Gastrointestinal: Negative for abdominal pain, diarrhea and constipation.  Genitourinary: Negative for difficulty urinating.  Musculoskeletal: Negative for back pain and gait problem.  Skin: Negative for rash.  Neurological: Negative  for syncope, light-headedness and headaches.  All other systems reviewed and are negative.   Per HPI unless specifically indicated above     Medication List       This list is accurate as of: 03/31/15  1:34 PM.  Always use your most recent med list.               cetirizine 10 MG tablet  Commonly known as:  ZYRTEC  Take 1 tablet (10 mg total) by mouth daily.     doxycycline 100 MG tablet  Commonly known as:  VIBRA-TABS  Take 1 tablet (100 mg total) by mouth 2 (two) times daily. 1 po bid     fluticasone 50 MCG/ACT nasal spray  Commonly known as:  FLONASE  Place 2 sprays into both nostrils daily.     lisinopril-hydrochlorothiazide 10-12.5 MG tablet  Commonly known as:  PRINZIDE,ZESTORETIC  Take 1 tablet by mouth daily.     omeprazole 40 MG capsule  Commonly known as:  PRILOSEC  TAKE 1 CAPSULE (40 MG TOTAL) BY MOUTH DAILY.     pravastatin 40 MG tablet  Commonly known as:  PRAVACHOL  Take 1 tablet (40 mg total) by mouth daily.     sildenafil 100 MG tablet  Commonly known as:  VIAGRA  TAKE 1 TABLET (50 MG TOTAL) BY MOUTH DAILY AS NEEDED FOR ERECTILE DYSFUNCTION.           Objective:    BP 111/72 mmHg  Pulse 98  Temp(Src) 97.1 F (36.2 C) (Oral)  Ht 5'  9" (1.753 m)  Wt 200 lb (90.719 kg)  BMI 29.52 kg/m2  Wt Readings from Last 3 Encounters:  03/31/15 200 lb (90.719 kg)  12/21/14 200 lb (90.719 kg)  06/28/14 198 lb (89.812 kg)    Physical Exam  Constitutional: He is oriented to person, place, and time. He appears well-developed and well-nourished. No distress.  HENT:  Right Ear: Tympanic membrane, external ear and ear canal normal.  Left Ear: External ear and ear canal normal. No mastoid tenderness. Tympanic membrane is retracted. Tympanic membrane is not perforated, not erythematous and not bulging.  No middle ear effusion.  Nose: Mucosal edema and rhinorrhea present. No sinus tenderness. No epistaxis. Right sinus exhibits maxillary sinus tenderness. Right  sinus exhibits no frontal sinus tenderness. Left sinus exhibits maxillary sinus tenderness. Left sinus exhibits no frontal sinus tenderness.  Mouth/Throat: Uvula is midline and mucous membranes are normal. Posterior oropharyngeal edema and posterior oropharyngeal erythema present. No oropharyngeal exudate or tonsillar abscesses.  Eyes: Conjunctivae and EOM are normal. Pupils are equal, round, and reactive to light. Right eye exhibits no discharge. No scleral icterus.  Neck: Neck supple. No thyromegaly present.  Cardiovascular: Normal rate, regular rhythm, normal heart sounds and intact distal pulses.   No murmur heard. Pulmonary/Chest: Effort normal and breath sounds normal. No respiratory distress. He has no wheezes. He has no rales.  Musculoskeletal: Normal range of motion. He exhibits no edema.  Lymphadenopathy:    He has no cervical adenopathy.  Neurological: He is alert and oriented to person, place, and time. Coordination normal.  Skin: Skin is warm and dry. No rash noted. He is not diaphoretic.  Psychiatric: He has a normal mood and affect. His behavior is normal.  Nursing note and vitals reviewed.   Results for orders placed or performed in visit on 12/21/14  Lipid panel  Result Value Ref Range   Cholesterol, Total 204 (H) 100 - 199 mg/dL   Triglycerides 113 0 - 149 mg/dL   HDL 53 >39 mg/dL   VLDL Cholesterol Cal 23 5 - 40 mg/dL   LDL Calculated 128 (H) 0 - 99 mg/dL   Chol/HDL Ratio 3.8 0.0 - 5.0 ratio units  BMP8+EGFR  Result Value Ref Range   Glucose 101 (H) 65 - 99 mg/dL   BUN 11 6 - 24 mg/dL   Creatinine, Ser 1.02 0.76 - 1.27 mg/dL   GFR calc non Af Amer 92 >59 mL/min/1.73   GFR calc Af Amer 106 >59 mL/min/1.73   BUN/Creatinine Ratio 11 9 - 20   Sodium 139 136 - 144 mmol/L   Potassium 4.6 3.5 - 5.2 mmol/L   Chloride 97 97 - 106 mmol/L   CO2 24 18 - 29 mmol/L   Calcium 9.9 8.7 - 10.2 mg/dL      Assessment & Plan:       Problem List Items Addressed This Visit     None    Visit Diagnoses    Sore throat    -  Primary    Relevant Medications    doxycycline (VIBRA-TABS) 100 MG tablet    Other Relevant Orders    Veritor Flu A/B Waived    Rapid strep screen (not at [facility])    Chills        Relevant Medications    doxycycline (VIBRA-TABS) 100 MG tablet    Other Relevant Orders    Veritor Flu A/B Waived    Rapid strep screen (not at [facility])    Acute bronchitis, unspecified organism  Relevant Medications    doxycycline (VIBRA-TABS) 100 MG tablet        Follow up plan: Return if symptoms worsen or fail to improve.  Counseling provided for all of the vaccine components Orders Placed This Encounter  Procedures  . Veritor Flu A/B Waived  . Rapid strep screen (not at [facility])    Caryl Pina, MD [facility] 03/31/2015, 1:34 PM

## 2015-05-17 ENCOUNTER — Encounter (INDEPENDENT_AMBULATORY_CARE_PROVIDER_SITE_OTHER): Payer: Self-pay

## 2015-05-17 ENCOUNTER — Encounter: Payer: Self-pay | Admitting: Family Medicine

## 2015-05-17 ENCOUNTER — Ambulatory Visit (INDEPENDENT_AMBULATORY_CARE_PROVIDER_SITE_OTHER): Payer: BLUE CROSS/BLUE SHIELD | Admitting: Family Medicine

## 2015-05-17 ENCOUNTER — Other Ambulatory Visit: Payer: BLUE CROSS/BLUE SHIELD

## 2015-05-17 VITALS — BP 124/83 | HR 107 | Temp 97.8°F | Ht 69.0 in | Wt 198.0 lb

## 2015-05-17 DIAGNOSIS — J029 Acute pharyngitis, unspecified: Secondary | ICD-10-CM | POA: Diagnosis not present

## 2015-05-17 DIAGNOSIS — B349 Viral infection, unspecified: Secondary | ICD-10-CM

## 2015-05-17 LAB — CULTURE, GROUP A STREP

## 2015-05-17 LAB — RAPID STREP SCREEN (MED CTR MEBANE ONLY): Strep Gp A Ag, IA W/Reflex: NEGATIVE

## 2015-05-17 MED ORDER — TOBRAMYCIN-DEXAMETHASONE 0.3-0.1 % OP SUSP: 1.0000 [drp] | OPHTHALMIC | Status: DC

## 2015-05-17 MED ORDER — BETAMETHASONE SOD PHOS & ACET 6 (3-3) MG/ML IJ SUSP
6.0000 mg | Freq: Once | INTRAMUSCULAR | Status: AC
Start: 2015-05-17 — End: 2015-05-17
  Administered 2015-05-17: 6 mg via INTRAMUSCULAR

## 2015-05-17 NOTE — Progress Notes (Signed)
Subjective:  Patient ID: WILSON DUSENBERY, male    DOB: [birth year]  Age: 40 y.o. MRN: [id number]  CC: URI   HPI PINCHOS TOPEL presents for Patient presents with upper respiratory congestion. Rhinorrhea that is frequently purulent. There is moderate sore throat. Patient reports coughing frequently as well.-colored/purulent sputum noted. There is no fever no chills no sweats. The patient denies being short of breath. Onset was 3-5 days ago. Gradually worsening in spite of home remedies.    History Rameen has a past medical history of Allergy and GERD (gastroesophageal reflux disease).   He has no past surgical history on file.   His family history includes Cancer in his mother; Heart disease in his father; Stroke in his brother.He reports that he has quit smoking. His smoking use included Cigars. He does not have any smokeless tobacco history on file. He reports that he does not drink alcohol. His drug history is not on file.    ROS Review of Systems  Constitutional: Negative for fever, chills, activity change and appetite change.  HENT: Positive for congestion, postnasal drip and rhinorrhea. Negative for ear discharge, ear pain, hearing loss, nosebleeds, sinus pressure, sneezing and trouble swallowing.   Respiratory: Positive for cough. Negative for chest tightness and shortness of breath.   Cardiovascular: Negative for chest pain and palpitations.  Skin: Negative for rash.  Neurological: Negative for dizziness and headaches.    Objective:  BP 124/83 mmHg  Pulse 107  Temp(Src) 97.8 F (36.6 C) (Oral)  Ht 5' 9"  (1.753 m)  Wt 198 lb (89.812 kg)  BMI 29.23 kg/m2  SpO2 95%  BP Readings from Last 3 Encounters:  05/17/15 124/83  03/31/15 111/72  12/21/14 125/90    Wt Readings from Last 3 Encounters:  05/17/15 198 lb (89.812 kg)  03/31/15 200 lb (90.719 kg)  12/21/14 200 lb (90.719 kg)     Physical Exam  Constitutional: He appears well-developed and well-nourished.    HENT:  Head: Normocephalic and atraumatic.  Right Ear: Tympanic membrane and external ear normal. No decreased hearing is noted.  Left Ear: Tympanic membrane and external ear normal. No decreased hearing is noted.  Mouth/Throat: No oropharyngeal exudate or posterior oropharyngeal erythema.  Eyes: Pupils are equal, round, and reactive to light.  Neck: Normal range of motion. Neck supple.  Cardiovascular: Normal rate and regular rhythm.   No murmur heard. Pulmonary/Chest: Breath sounds normal. No respiratory distress.  Abdominal: Soft. Bowel sounds are normal. He exhibits no mass. There is no tenderness.  Vitals reviewed.    Lab Results  Component Value Date   WBC 9.3 04/28/2014   HGB 14.9 04/28/2014   HCT 48.5 04/28/2014   PLT 224 08/09/2007   GLUCOSE 101* 12/21/2014   CHOL 204* 12/21/2014   TRIG 113 12/21/2014   HDL 53 12/21/2014   LDLCALC 128* 12/21/2014   ALT 26 08/09/2007   AST 18 08/09/2007   NA 139 12/21/2014   K 4.6 12/21/2014   CL 97 12/21/2014   CREATININE 1.02 12/21/2014   BUN 11 12/21/2014   CO2 24 12/21/2014   TSH 0.733 05/25/2014    Dg Lumbar Spine Complete  08/09/2007  Clinical Data: Middle lower back pain.  No known injury  LUMBAR SPINE - COMPLETE 4+ VIEW  Comparison: None  Findings: There are five lumbar-type vertebral bodies.  Lumbar vertebral bodies are normal in height and alignment.  No fracture or pars defect is identified.  No significant degenerative change. The sacroiliac joints are unremarkable.  The visualized bowel gas pattern is nonobstructive.  IMPRESSION: Negative lumbar spine radiographs. Provider: Oletha Blend   Assessment & Plan:   Ahan was seen today for uri.  Diagnoses and all orders for this visit:  Acute viral syndrome -     CBC with Differential/Platelet -     CMP14+EGFR -     DG Chest 2 View; Future  Sore throat -     Rapid strep screen (not at [facility]) -     betamethasone acetate-betamethasone sodium phosphate (CELESTONE)  injection 6 mg; Inject 1 mL (6 mg total) into the muscle once. -     CBC with Differential/Platelet -     CMP14+EGFR -     DG Chest 2 View; Future  Other orders -     tobramycin-dexamethasone (TOBRADEX) ophthalmic solution; Place 1 drop into both eyes every 4 (four) hours while awake.    I have discontinued Mr. Wimer doxycycline. I am also having him start on tobramycin-dexamethasone. Additionally, I am having him maintain his cetirizine, fluticasone, lisinopril-hydrochlorothiazide, sildenafil, omeprazole, and pravastatin. We will continue to administer betamethasone acetate-betamethasone sodium phosphate.  Meds ordered this encounter  Medications  . betamethasone acetate-betamethasone sodium phosphate (CELESTONE) injection 6 mg    Sig:   . tobramycin-dexamethasone (TOBRADEX) ophthalmic solution    Sig: Place 1 drop into both eyes every 4 (four) hours while awake.    Dispense:  5 mL    Refill:  0     Follow-up: Return if symptoms worsen or fail to improve.  Claretta Fraise, M.D.

## 2015-05-17 NOTE — Addendum Note (Signed)
Addended by: Bearl MulberryUTHERFORD, Burna Atlas K on: 05/17/2015 10:09 AM   Modules accepted: SmartSet

## 2015-05-18 LAB — CMP14+EGFR
A/G RATIO: 1.6 (ref 1.2–2.2)
ALT: 31 IU/L (ref 0–44)
AST: 23 IU/L (ref 0–40)
Albumin: 4.7 g/dL (ref 3.5–5.5)
Alkaline Phosphatase: 67 IU/L (ref 39–117)
BILIRUBIN TOTAL: 0.3 mg/dL (ref 0.0–1.2)
BUN/Creatinine Ratio: 10 (ref 9–20)
BUN: 10 mg/dL (ref 6–24)
CALCIUM: 9.6 mg/dL (ref 8.7–10.2)
CHLORIDE: 98 mmol/L (ref 96–106)
CO2: 22 mmol/L (ref 18–29)
Creatinine, Ser: 0.97 mg/dL (ref 0.76–1.27)
GFR calc Af Amer: 112 mL/min/{1.73_m2} (ref >59)
GFR, EST NON AFRICAN AMERICAN: 97 mL/min/{1.73_m2} (ref >59)
GLUCOSE: 105 mg/dL — AB (ref 65–99)
Globulin, Total: 2.9 g/dL (ref 1.5–4.5)
POTASSIUM: 4.3 mmol/L (ref 3.5–5.2)
Sodium: 139 mmol/L (ref 134–144)
Total Protein: 7.6 g/dL (ref 6.0–8.5)

## 2015-05-18 LAB — CBC WITH DIFFERENTIAL/PLATELET
BASOS: 0 %
Basophils Absolute: 0 10*3/uL (ref 0.0–0.2)
EOS (ABSOLUTE): 0 10*3/uL (ref 0.0–0.4)
EOS: 0 %
Hematocrit: 43.9 % (ref 37.5–51.0)
Hemoglobin: 14.7 g/dL (ref 12.6–17.7)
IMMATURE GRANS (ABS): 0 10*3/uL (ref 0.0–0.1)
IMMATURE GRANULOCYTES: 0 %
LYMPHS: 33 %
Lymphocytes Absolute: 2.3 10*3/uL (ref 0.7–3.1)
MCH: 27.5 pg (ref 26.6–33.0)
MCHC: 33.5 g/dL (ref 31.5–35.7)
MCV: 82 fL (ref 79–97)
MONOCYTES: 10 %
Monocytes Absolute: 0.7 10*3/uL (ref 0.1–0.9)
NEUTROS PCT: 57 %
Neutrophils Absolute: 3.9 10*3/uL (ref 1.4–7.0)
PLATELETS: 301 10*3/uL (ref 150–379)
RBC: 5.35 x10E6/uL (ref 4.14–5.80)
RDW: 15.4 % (ref 12.3–15.4)
WBC: 6.9 10*3/uL (ref 3.4–10.8)

## 2015-07-17 ENCOUNTER — Other Ambulatory Visit: Payer: Self-pay | Admitting: Pediatrics

## 2015-08-21 ENCOUNTER — Telehealth: Payer: Self-pay | Admitting: Family Medicine

## 2015-08-21 ENCOUNTER — Encounter: Payer: Self-pay | Admitting: Family Medicine

## 2015-08-21 ENCOUNTER — Ambulatory Visit (INDEPENDENT_AMBULATORY_CARE_PROVIDER_SITE_OTHER): Payer: BLUE CROSS/BLUE SHIELD | Admitting: Family Medicine

## 2015-08-21 VITALS — BP 108/80 | HR 79 | Temp 97.3°F | Ht 69.0 in | Wt 215.6 lb

## 2015-08-21 DIAGNOSIS — I10 Essential (primary) hypertension: Secondary | ICD-10-CM | POA: Diagnosis not present

## 2015-08-21 DIAGNOSIS — R208 Other disturbances of skin sensation: Secondary | ICD-10-CM

## 2015-08-21 DIAGNOSIS — N529 Male erectile dysfunction, unspecified: Secondary | ICD-10-CM | POA: Diagnosis not present

## 2015-08-21 DIAGNOSIS — R200 Anesthesia of skin: Secondary | ICD-10-CM

## 2015-08-21 MED ORDER — TADALAFIL 5 MG PO TABS: 5.0000 mg | ORAL_TABLET | Freq: Every day | ORAL | 3 refills | Status: DC

## 2015-08-21 MED ORDER — LISINOPRIL-HYDROCHLOROTHIAZIDE 10-12.5 MG PO TABS: 1.0000 | ORAL_TABLET | Freq: Every day | ORAL | 3 refills | Status: DC

## 2015-08-21 MED ORDER — TADALAFIL 5 MG PO TABS: 5.0000 mg | ORAL_TABLET | Freq: Every day | ORAL | 3 refills | Status: DC | PRN

## 2015-08-21 NOTE — Progress Notes (Signed)
   HPI  Patient presents today for follow-up for hypertension.  Hypertension Good medication compliance No chest pain, dyspnea, palpitations, leg edema He works as a Emergency planning/management officerpolice officer, he exercises regularly. He does have dietary indiscretion, eating fast food several times a week.  Erectile dysfunction He requests medication that can be taken every day regardless of sexual activity. Viagra works, however it is cumbersome as he has to wait several minutes after taking the pill for it to work.  Hand numbness Occurs occasionally, BL and not predominant in either hand., described as numbness and tingling type sensation. Not associated with neck pain No loss of function, no weakness, no current problems  PMH: Smoking status noted ROS: Per HPI  Objective: BP 108/80 (BP Location: Left Arm, Patient Position: Sitting, Cuff Size: Large)   Pulse 79   Temp 97.3 F (36.3 C) (Oral)   Ht 5\' 9"  (1.753 m)   Wt 215 lb 9.6 oz (97.8 kg)   BMI 31.84 kg/m  Gen: NAD, alert, cooperative with exam HEENT: NCAT CV: RRR, good S1/S2, no murmur Resp: CTABL, no wheezes, non-labored Ext: No edema, warm Neuro: Alert and oriented, No gross deficits  Assessment and plan:  # Hypertension Well-controlled Refilled Prinzide Labs due in May, recommended following up in December or January for routine lipid panel as well as CMP.  # Erectile dysfunction Changing Viagra to Cialis, he will check pricing and see if this helps him. Daily dosing.  Hand numbness Unclear etiology, mild Reassurance provided Consider cervical disc disease as well as diabetes, when labs are due consider A1c    Murtis SinkSam Bradshaw, MD [facility] 08/21/2015, 2:05 PM

## 2015-08-21 NOTE — Patient Instructions (Signed)
Great to meet you!  Lets plan to see you again in December for labs and to check your blood pressure

## 2015-08-21 NOTE — Telephone Encounter (Signed)
Pt aware that med was sent today [facility] aware as well

## 2015-08-24 ENCOUNTER — Telehealth: Payer: Self-pay

## 2015-08-24 NOTE — Telephone Encounter (Signed)
Forwarding to Dr. Ermalinda MemosBradshaw

## 2015-08-24 NOTE — Telephone Encounter (Signed)
Pt notified of RX Verbalizes understanding 

## 2015-08-24 NOTE — Telephone Encounter (Signed)
Pt understands that this may not be covered, There is not likely a good daily alternative. recommend continued PRN dosing.   Murtis SinkSam Jessikah Dicker, MD [facility] 08/24/2015, 2:21 PM

## 2015-11-13 ENCOUNTER — Other Ambulatory Visit: Payer: Self-pay | Admitting: Pediatrics

## 2015-11-13 DIAGNOSIS — E785 Hyperlipidemia, unspecified: Secondary | ICD-10-CM

## 2015-11-18 ENCOUNTER — Other Ambulatory Visit: Payer: Self-pay | Admitting: Pediatrics

## 2015-11-18 DIAGNOSIS — K219 Gastro-esophageal reflux disease without esophagitis: Secondary | ICD-10-CM

## 2016-01-24 ENCOUNTER — Other Ambulatory Visit: Payer: Self-pay | Admitting: Pediatrics

## 2016-01-24 DIAGNOSIS — K219 Gastro-esophageal reflux disease without esophagitis: Secondary | ICD-10-CM

## 2016-02-13 ENCOUNTER — Other Ambulatory Visit: Payer: Self-pay | Admitting: Family Medicine

## 2016-02-13 DIAGNOSIS — J309 Allergic rhinitis, unspecified: Secondary | ICD-10-CM

## 2016-02-14 ENCOUNTER — Other Ambulatory Visit: Payer: Self-pay | Admitting: Pediatrics

## 2016-02-14 DIAGNOSIS — E785 Hyperlipidemia, unspecified: Secondary | ICD-10-CM

## 2016-02-14 NOTE — Telephone Encounter (Signed)
Refill denied- NTBS 

## 2016-02-14 NOTE — Telephone Encounter (Signed)
Pt aware. appt made for 2/9

## 2016-02-19 ENCOUNTER — Other Ambulatory Visit: Payer: Self-pay | Admitting: Pediatrics

## 2016-02-19 DIAGNOSIS — K219 Gastro-esophageal reflux disease without esophagitis: Secondary | ICD-10-CM

## 2016-02-23 ENCOUNTER — Ambulatory Visit (INDEPENDENT_AMBULATORY_CARE_PROVIDER_SITE_OTHER): Payer: BLUE CROSS/BLUE SHIELD | Admitting: Pediatrics

## 2016-02-23 ENCOUNTER — Encounter: Payer: Self-pay | Admitting: Pediatrics

## 2016-02-23 VITALS — BP 127/89 | HR 110 | Temp 97.8°F | Ht 69.0 in | Wt 236.6 lb

## 2016-02-23 DIAGNOSIS — J019 Acute sinusitis, unspecified: Secondary | ICD-10-CM

## 2016-02-23 DIAGNOSIS — J069 Acute upper respiratory infection, unspecified: Secondary | ICD-10-CM | POA: Diagnosis not present

## 2016-02-23 DIAGNOSIS — E785 Hyperlipidemia, unspecified: Secondary | ICD-10-CM

## 2016-02-23 DIAGNOSIS — Z6829 Body mass index (BMI) 29.0-29.9, adult: Secondary | ICD-10-CM | POA: Diagnosis not present

## 2016-02-23 DIAGNOSIS — R739 Hyperglycemia, unspecified: Secondary | ICD-10-CM

## 2016-02-23 DIAGNOSIS — I10 Essential (primary) hypertension: Secondary | ICD-10-CM

## 2016-02-23 DIAGNOSIS — K219 Gastro-esophageal reflux disease without esophagitis: Secondary | ICD-10-CM

## 2016-02-23 LAB — CMP14+EGFR
ALBUMIN: 4.6 g/dL (ref 3.5–5.5)
ALT: 20 IU/L (ref 0–44)
AST: 14 IU/L (ref 0–40)
Albumin/Globulin Ratio: 1.7 (ref 1.2–2.2)
Alkaline Phosphatase: 67 IU/L (ref 39–117)
BUN / CREAT RATIO: 10 (ref 9–20)
BUN: 10 mg/dL (ref 6–24)
Bilirubin Total: 0.4 mg/dL (ref 0.0–1.2)
CALCIUM: 9.4 mg/dL (ref 8.7–10.2)
CO2: 24 mmol/L (ref 18–29)
CREATININE: 1 mg/dL (ref 0.76–1.27)
Chloride: 97 mmol/L (ref 96–106)
GFR calc Af Amer: 108 mL/min/{1.73_m2} (ref >59)
GFR, EST NON AFRICAN AMERICAN: 93 mL/min/{1.73_m2} (ref >59)
GLUCOSE: 100 mg/dL — AB (ref 65–99)
Globulin, Total: 2.7 g/dL (ref 1.5–4.5)
Potassium: 4.2 mmol/L (ref 3.5–5.2)
Sodium: 139 mmol/L (ref 134–144)
TOTAL PROTEIN: 7.3 g/dL (ref 6.0–8.5)

## 2016-02-23 LAB — BAYER DCA HB A1C WAIVED: HB A1C (BAYER DCA - WAIVED): 6.4 % (ref <7.0)

## 2016-02-23 LAB — LIPID PANEL
Chol/HDL Ratio: 4.4 ratio units (ref 0.0–5.0)
Cholesterol, Total: 200 mg/dL — ABNORMAL HIGH (ref 100–199)
HDL: 45 mg/dL (ref >39)
LDL CALC: 116 mg/dL — AB (ref 0–99)
TRIGLYCERIDES: 196 mg/dL — AB (ref 0–149)
VLDL Cholesterol Cal: 39 mg/dL (ref 5–40)

## 2016-02-23 MED ORDER — FLUTICASONE PROPIONATE 50 MCG/ACT NA SUSP: 2.0000 | Freq: Every day | NASAL | 6 refills | Status: DC

## 2016-02-23 MED ORDER — DOXYCYCLINE HYCLATE 100 MG PO TABS: 100.0000 mg | ORAL_TABLET | Freq: Two times a day (BID) | ORAL | 0 refills | Status: DC

## 2016-02-23 MED ORDER — OMEPRAZOLE 40 MG PO CPDR: DELAYED_RELEASE_CAPSULE | ORAL | 5 refills | Status: DC

## 2016-02-23 NOTE — Progress Notes (Signed)
  Subjective:   Patient ID: James Snyder, male    DOB: 04-04-1975, 41 y.o.   MRN: [id number] CC: Follow-up; Sinus Pressure; Cough; and Sore Throat  HPI: James Snyder is a 41 y.o. male presenting for Follow-up; Sinus Pressure; Cough; and Sore Throat  Sinus pressure started yesterday Sore throat started a few days ago No fevers appetite has been fine Coughing some off and on Worse at night Takes allergy pill daily Taking tylenol, ibuprofen since he has been sick  HTN: not checking at home No CP, no SOB Taking meds regularly  GER: taking prilosec daily, has symptoms when not on it  HLD: taking meds regularly, no s/e  Relevant past medical, surgical, family and social history reviewed. Allergies and medications reviewed and updated. History  Smoking Status  . Former Smoker  . Types: Cigars  Smokeless Tobacco  . Former Systems developer    Comment: every once in a while   ROS: Per HPI   Objective:    BP 127/89   Pulse (!) 110   Temp 97.8 F (36.6 C) (Oral)   Ht '5\' 9"'$  (1.753 m)   Wt 236 lb 9.6 oz (107.3 kg)   BMI 34.94 kg/m   Wt Readings from Last 3 Encounters:  02/23/16 236 lb 9.6 oz (107.3 kg)  08/21/15 215 lb 9.6 oz (97.8 kg)  05/17/15 198 lb (89.8 kg)    Gen: NAD, alert, cooperative with exam, NCAT EYES: EOMI, no conjunctival injection, or no icterus ENT:  TMs gray b/l, OP without erythema, TTP over max sinuses b/l LYMPH: no cervical LAD CV: NRRR, normal S1/S2, no murmur, distal pulses 2+ b/l Resp: CTABL, no wheezes, normal WOB Ext: No edema, warm Neuro: Alert and oriented MSK: normal muscle bulk  Assessment & Plan:  James Snyder was seen today for follow-up, sinus pressure, cough and sore throat.  Diagnoses and all orders for this visit:  Essential hypertension Slightly elevated diastolic today Cont to check at home, cont current meds -     CMP14+EGFR  Hyperglycemia -     Bayer DCA Hb A1c Waived  Gastroesophageal reflux disease, esophagitis presence not  specified Cont PPI, has symptoms when he misses it -     omeprazole (PRILOSEC) 40 MG capsule; TAKE 1 CAPSULE (40 MG TOTAL) BY MOUTH DAILY.  BMI 29.0-29.9,adult Cont lifestyle changes trying to stay active regularly Increase vegetable intake, decrease sugar intake  Hyperlipidemia, unspecified hyperlipidemia type Cont statin -     Lipid panel  Acute URI Discussed symptomatic care Antibiotics at this point will not hasten symptom improvement -     fluticasone (FLONASE) 50 MCG/ACT nasal spray; Place 2 sprays into both nostrils daily.  Acute sinusitis, recurrence not specified, unspecified location If no improvement end of next week can start below -     doxycycline (VIBRA-TABS) 100 MG tablet; Take 1 tablet (100 mg total) by mouth 2 (two) times daily.   Follow up plan: prn Assunta Found, MD [place]

## 2016-02-23 NOTE — Patient Instructions (Addendum)
Netipot with distilled water 2-3 times a day to clear out sinuses Or Normal saline nasal spray Flonase steroid nasal spray Antihistamine daily such as cetirizine Ibuprofen 600mg  up to three times a day for aches, fevers, tylenol every 6 hours as needed for aches, fevers Lots of fluids  Start antibiotic if not improving after 7-10 days.  Upper Respiratory Infection, Adult Most upper respiratory infections (URIs) are caused by a virus. A URI affects the nose, throat, and upper air passages. The most common type of URI is often called "the common cold." Follow these instructions at home:  Take medicines only as told by your doctor.  Gargle warm saltwater or take cough drops to comfort your throat as told by your doctor.  Use a warm mist humidifier or inhale steam from a shower to increase air moisture. This may make it easier to breathe.  Drink enough fluid to keep your pee (urine) clear or pale yellow.  Eat soups and other clear broths.  Have a healthy diet.  Rest as needed.  Go back to work when your fever is gone or your doctor says it is okay.  You may need to stay home longer to avoid giving your URI to others.  You can also wear a face mask and wash your hands often to prevent spread of the virus.  Use your inhaler more if you have asthma.  Do not use any tobacco products, including cigarettes, chewing tobacco, or electronic cigarettes. If you need help quitting, ask your doctor. Contact a doctor if:  You are getting worse, not better.  Your symptoms are not helped by medicine.  You have chills.  You are getting more short of breath.  You have brown or red mucus.  You have yellow or brown discharge from your nose.  You have pain in your face, especially when you bend forward.  You have a fever.  You have puffy (swollen) neck glands.  You have pain while swallowing.  You have white areas in the back of your throat. Get help right away if:  You have very  bad or constant:  Headache.  Ear pain.  Pain in your forehead, behind your eyes, and over your cheekbones (sinus pain).  Chest pain.  You have long-lasting (chronic) lung disease and any of the following:  Wheezing.  Long-lasting cough.  Coughing up blood.  A change in your usual mucus.  You have a stiff neck.  You have changes in your:  Vision.  Hearing.  Thinking.  Mood. This information is not intended to replace advice given to you by your health care provider. Make sure you discuss any questions you have with your health care provider. Document Released: 06/19/2007 Document Revised: 09/03/2015 Document Reviewed: 04/07/2013 Elsevier Interactive Patient Education  2017 [employer]

## 2016-02-29 ENCOUNTER — Encounter: Payer: Self-pay | Admitting: Pediatrics

## 2016-02-29 ENCOUNTER — Other Ambulatory Visit: Payer: Self-pay | Admitting: *Deleted

## 2016-02-29 ENCOUNTER — Other Ambulatory Visit: Payer: Self-pay | Admitting: Pediatrics

## 2016-02-29 DIAGNOSIS — N529 Male erectile dysfunction, unspecified: Secondary | ICD-10-CM

## 2016-02-29 DIAGNOSIS — R7303 Prediabetes: Secondary | ICD-10-CM | POA: Insufficient documentation

## 2016-02-29 MED ORDER — SILDENAFIL CITRATE 100 MG PO TABS: ORAL_TABLET | ORAL | 2 refills | Status: DC

## 2016-02-29 NOTE — Progress Notes (Signed)
Per pharmacy they did not receive RX for Viagra [facility] Dr Oswaldo DoneVincent

## 2016-02-29 NOTE — Telephone Encounter (Signed)
Please review and advise.

## 2016-02-29 NOTE — Telephone Encounter (Signed)
Pt aware.

## 2016-03-29 ENCOUNTER — Encounter: Payer: Self-pay | Admitting: Nurse Practitioner

## 2016-03-29 ENCOUNTER — Ambulatory Visit (INDEPENDENT_AMBULATORY_CARE_PROVIDER_SITE_OTHER): Payer: Worker's Compensation | Admitting: Nurse Practitioner

## 2016-03-29 VITALS — BP 132/84 | HR 94 | Temp 98.4°F | Ht 69.0 in | Wt 236.0 lb

## 2016-03-29 DIAGNOSIS — S61451A Open bite of right hand, initial encounter: Secondary | ICD-10-CM | POA: Diagnosis not present

## 2016-03-29 DIAGNOSIS — W503XXA Accidental bite by another person, initial encounter: Secondary | ICD-10-CM

## 2016-03-29 DIAGNOSIS — Z23 Encounter for immunization: Secondary | ICD-10-CM

## 2016-03-29 MED ORDER — SULFAMETHOXAZOLE-TRIMETHOPRIM 800-160 MG PO TABS: 1.0000 | ORAL_TABLET | Freq: Two times a day (BID) | ORAL | 0 refills | Status: DC

## 2016-03-29 NOTE — Addendum Note (Signed)
Addended by: Bearl MulberryUTHERFORD, NATALIE K on: 03/29/2016 05:20 PM   Modules accepted: Orders
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
## 2016-04-28 ENCOUNTER — Other Ambulatory Visit: Payer: Self-pay | Admitting: Pediatrics

## 2016-04-28 DIAGNOSIS — E785 Hyperlipidemia, unspecified: Secondary | ICD-10-CM

## 2016-04-29 ENCOUNTER — Encounter: Payer: Self-pay | Admitting: Family

## 2016-04-29 ENCOUNTER — Ambulatory Visit (INDEPENDENT_AMBULATORY_CARE_PROVIDER_SITE_OTHER): Payer: BLUE CROSS/BLUE SHIELD | Admitting: Family

## 2016-04-29 VITALS — BP 106/76 | HR 82 | Temp 97.7°F | Ht 69.0 in | Wt 200.0 lb

## 2016-04-29 DIAGNOSIS — Z09 Encounter for follow-up examination after completed treatment for conditions other than malignant neoplasm: Secondary | ICD-10-CM

## 2016-04-29 DIAGNOSIS — I471 Supraventricular tachycardia, unspecified: Secondary | ICD-10-CM

## 2016-04-29 DIAGNOSIS — Z8249 Family history of ischemic heart disease and other diseases of the circulatory system: Secondary | ICD-10-CM | POA: Diagnosis not present

## 2016-04-29 DIAGNOSIS — E785 Hyperlipidemia, unspecified: Secondary | ICD-10-CM | POA: Diagnosis not present

## 2016-04-29 MED ORDER — PRAVASTATIN SODIUM 40 MG PO TABS: 40.0000 mg | ORAL_TABLET | Freq: Every day | ORAL | 0 refills | Status: DC

## 2016-04-29 NOTE — Patient Instructions (Signed)
Supraventricular Tachycardia, Adult °Supraventricular tachycardia (SVT) is a type of abnormal heart rhythm. It causes the heart to beat very quickly and then return to a normal speed. °A normal heart rate is 60-100 beats per minute. During an episode of SVT, your heart rate may be higher than 150 beats per minute. Episodes of SVT can be frightening, but they are usually not dangerous. However, if episodes happens often or last for long periods of time, they may lead to heart failure. °What are the causes? °Usually, a normal heartbeat starts when an area called the sinoatrial node releases an electrical signal. In SVT, other areas of the heart send out electrical signals that interfere with the signal from the sinoatrial node. °It is not known why some people get SVT and others do not. °What increases the risk? °This condition is more likely to develop in: °· People who are 12?42 years old. °· Women. ° °Factors that may increase your chances of an attack include: °· Stress. °· Tiredness. °· Smoking. °· Stimulant drugs, such as cocaine and methamphetamine. °· Alcohol. °· Caffeine. °· Pregnancy. °· Anxiety. ° °What are the signs or symptoms? °Symptoms of this condition include: °· A pounding heart. °· A feeling that the heart is skipping beats (palpitations). °· Weakness. °· Shortness of breath. °· Tightness or pain in your chest. °· Light-headedness. °· Anxiety. °· Dizziness. °· Sweating. °· Nausea. °· Fainting. °· Fatigue or tiredness. ° °A mild episode may not cause symptoms. °How is this diagnosed? °This condition may be diagnosed based on: °· Your symptoms. °· A physical exam. If you are have an episode of SVT during the exam, the health care provider may be able to diagnose SVT by listening to your heart and feeling your pulse. °· Tests. These may include: °? An electrocardiogram (ECG). This test is done to check for problems with electrical activity in the heart. °? A Holter monitor or event monitor test. This  test involves wearing a portable device that monitors your heart rate over time. °? An echocardiogram. This test involves taking an image of your heart using sound waves. It is done to rule out other causes of a fast heart rate. °? Blood tests. ° °How is this treated? °This condition may be treated with: °· Vagal nerve stimulation. The treatment involves stimulating your vagus nerve, which slows down the heart. It is often the first and only treatment that is needed for this condition. It is a good idea to try the several ways of doing vagal stimulation to find which one works best for you. Ways to do this treatment include: °? Holding your breath and pushing, as though you are having a bowel movement. °? Massaging an area on one side of your neck, below your jaw. Do not try this yourself. Only a health care provider should do this. If done the wrong way, it can lead to a stroke. °? Bending forward with your head between your legs. °? Coughing while bending forward with your head between your legs. °? Closing your eyes and massaging your eyeballs. A health care provider should guide you through this method before you try it on your own. °· Medicines that prevent attacks. °· Medicine to stop an attack. The medicine is given through an IV tube at the hospital. °· A small electric shock (cardioversion) that stops an attack. Before you get the shock, you will get medicine to make you fall asleep. °· Radiofrequency ablation. In this procedure, a small, thin tube (catheter)   is used to send radiofrequency energy to the area of tissue that is causing the rapid heartbeats. The energy kills the cells and helps your heart keep a normal rhythm. You may have this treatment if you have symptoms of SVT often. ° °If you do not have symptoms, you may not need treatment. °Follow these instructions at home: °Stress °· Avoid stressful situations when possible. °· Find healthy ways of managing stress that work for you. Some healthy ways  to manage stress include: °? Taking part in relaxing activities, such as yoga, meditation, or being out in nature. °? Listening to relaxing music. °? Practicing relaxation techniques, such as deep breathing. °? Leading a healthy lifestyle. This involves getting plenty of sleep, exercising, and eating a balanced diet. °? Attending counseling or talk therapy with a mental health professional. °Sleep °· Try to get at least 7 hours of sleep each night. °Tobacco and nicotine °· Do not use any products that contain nicotine or tobacco, such as cigarettes and e-cigarettes. If you need help quitting, ask your health care provider. °Alcohol °· If alcohol triggers episodes of SVT, do not drink alcohol. °· If alcohol does not seem to trigger episodes, limit alcohol intake to no more than 1 drink a day for nonpregnant women and 2 drinks a day for men. One drink equals 12 oz of beer, 5 oz of wine, or 1½ oz of hard liquor. °Caffeine °· If caffeine triggers episodes of SVT, do not eat, drink, or use anything with caffeine in it. °· If caffeine does not seem to trigger episodes, consume caffeine in moderation. °Stimulant drugs °· Do not use stimulant drugs. If you need help quitting, talk with your health care provider. °General instructions °· Maintain a healthy weight. °· Exercise regularly. Ask your health care provider to suggest some good activities for you. Aim for one or a combination of the following: °? 150 minutes per week of moderate exercise, such as walking or yoga. °? 75 minutes per week of vigorous exercise, such as running or swimming. °· Perform vagus nerve stimulation as directed by your health care provider. °· Take over-the-counter and prescription medicines only as told by your health care provider. °Contact a health care provider if: °· You have episodes of SVT more often than before. °· Episodes of SVT last longer than before. °· Vagus nerve stimulation is no longer helping. °· You have new symptoms. °Get  help right away if: °· You have chest pain. °· Your symptoms get worse. °· You have trouble breathing. °· You have an episode of SVT that lasts longer than 20 minutes. °· You faint. °These symptoms may represent a serious problem that is an emergency. Do not wait to see if the symptoms will go away. Get medical help right away. Call your local emergency services (911 in the [place] Do not drive yourself to the hospital. °This information is not intended to replace advice given to you by your health care provider. Make sure you discuss any questions you have with your health care provider. °Document Released: 12/31/2004 Document Revised: 09/07/2015 Document Reviewed: 09/07/2015 °Elsevier Interactive Patient Education © 2017 [employer] ° °

## 2016-04-29 NOTE — Progress Notes (Signed)
   Subjective:    Patient ID: James Snyder, male    DOB: 08-19-1975, 41 y.o.   MRN: [id number]   HPI Pt presents to the office today for hospital follow up. Pt went to the ED on 04/25/16 for chest pain, dizziness, and palpitations. PT was diagnosed with supraventricular tachycardia. Pt was told to follow up with a cardiologists and was given Lopressor 25 mg. Pt states he has not started this medication. Pt denies any dizziness, chest pain, or palpitations at this time. Pt states he has a strong family history of cardiovascular disorders. States he had about 10 relatives diet at age 54 of MI or heart related problems.    Review of Systems  All other systems reviewed and are negative.      Objective:   Physical Exam  Constitutional: He is oriented to person, place, and time. He appears well-developed and well-nourished. No distress.  HENT:  Head: Normocephalic.  Right Ear: External ear normal.  Left Ear: External ear normal.  Nose: Nose normal.  Mouth/Throat: Oropharynx is clear and moist.  Eyes: Pupils are equal, round, and reactive to light. Right eye exhibits no discharge. Left eye exhibits no discharge.  Neck: Normal range of motion. Neck supple. No thyromegaly present.  Cardiovascular: Normal rate, regular rhythm, normal heart sounds and intact distal pulses.   No murmur heard. Pulmonary/Chest: Effort normal and breath sounds normal. No respiratory distress. He has no wheezes.  Abdominal: Soft. Bowel sounds are normal. He exhibits no distension. There is no tenderness.  Musculoskeletal: Normal range of motion. He exhibits no edema or tenderness.  Neurological: He is alert and oriented to person, place, and time.  Skin: Skin is warm and dry. No rash noted. No erythema.  Psychiatric: He has a normal mood and affect. His behavior is normal. Judgment and thought content normal.  Vitals reviewed.    BP 106/76   Pulse 82   Temp 97.7 F (36.5 C) (Oral)   Ht  (1.753 m)   Wt  200 lb (90.7 kg) Comment: All equipment removed weight.  BMI 29.53 kg/m      Assessment & Plan:  1. Hyperlipidemia, unspecified hyperlipidemia type - pravastatin (PRAVACHOL) 40 MG tablet; Take 1 tablet (40 mg total) by mouth daily.  Dispense: 90 tablet; Refill: 0 - Ambulatory referral to Cardiology  2. Family history of cardiovascular disease - Ambulatory referral to Cardiology  3. Supraventricular tachycardia (HCC) - Ambulatory referral to Cardiology  4. Hospital discharge follow-up - Ambulatory referral to Cardiology   Discussed pt should start lopressor 25 mg Avoid caffeine, alcohol  Referral to Cardiology  Discussed Vagal nerve stimulation RTO prn and go to ED if any palpitations, chest pain, or SOB  Jannifer Rodney, FNP

## 2016-05-20 ENCOUNTER — Ambulatory Visit (INDEPENDENT_AMBULATORY_CARE_PROVIDER_SITE_OTHER): Payer: BLUE CROSS/BLUE SHIELD | Admitting: Cardiology

## 2016-05-20 ENCOUNTER — Encounter: Payer: Self-pay | Admitting: Cardiology

## 2016-05-20 ENCOUNTER — Encounter: Payer: Self-pay | Admitting: *Deleted

## 2016-05-20 VITALS — BP 119/78 | HR 70 | Ht 68.0 in | Wt 213.0 lb

## 2016-05-20 DIAGNOSIS — I471 Supraventricular tachycardia: Secondary | ICD-10-CM | POA: Diagnosis not present

## 2016-05-20 MED ORDER — METOPROLOL TARTRATE 25 MG PO TABS: 12.5000 mg | ORAL_TABLET | Freq: Two times a day (BID) | ORAL | 1 refills | Status: DC

## 2016-05-20 NOTE — Progress Notes (Signed)
Clinical Summary [name] is a 41 y.o.male seen as new patient, referred by Dr James Snyder for SVT.  1. 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 K 3.3, Mg 2.1, TSH 1.51 - reports prior palpitations, but this was most severe episode. More intense, SOB. Occurred while at church. Lightheaded/dizzy. Reports had some energy drinks around that time.  - can still have some palpitations.  - dizziness after taking lopressor.       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 Allergy   . GERD (gastroesophageal reflux disease)      Allergies  Allergen Reactions  . Penicillins Hives     Current Outpatient Prescriptions  Medication Sig Dispense Refill  . cetirizine (ZYRTEC) 10 MG tablet TAKE 1 TABLET (10 MG TOTAL) BY MOUTH DAILY. 30 tablet 1  . fluticasone (FLONASE) 50 MCG/ACT nasal spray Place 2 sprays into both nostrils daily. 16 g 6  . lisinopril-hydrochlorothiazide (PRINZIDE,ZESTORETIC) 10-12.5 MG tablet Take 1 tablet by mouth daily. 90 tablet 3  . omeprazole (PRILOSEC) 40 MG capsule TAKE 1 CAPSULE (40 MG TOTAL) BY MOUTH DAILY. 30 capsule 5  . pravastatin (PRAVACHOL) 40 MG tablet Take 1 tablet (40 mg total) by mouth daily. 90 tablet 0  . sildenafil (VIAGRA) 100 MG tablet TAKE 1 TABLET (50 MG TOTAL) BY MOUTH DAILY AS NEEDED FOR ERECTILE DYSFUNCTION. 15 tablet 2  . tadalafil (CIALIS) 5 MG tablet Take 1 tablet (5 mg total) by mouth daily. 30 tablet 3  . tobramycin-dexamethasone (TOBRADEX) ophthalmic solution Place 1 drop into both eyes every 4 (four) hours while awake. 5 mL 0   No current facility-administered medications for this visit.      No past surgical history on file.   Allergies  Allergen Reactions  . Penicillins Hives      Family History  Problem Relation Age of Onset  . Cancer Mother   . Heart disease Father   . Stroke Brother      Social History [name] that he has  quit smoking. His smoking use included Cigars. He has quit using smokeless tobacco. [name] that he does not drink alcohol.   Review of Systems CONSTITUTIONAL: No weight loss, fever, chills, weakness or fatigue.  HEENT: Eyes: No visual loss, blurred vision, double vision or yellow sclerae.No hearing loss, sneezing, congestion, runny nose or sore throat.  SKIN: No rash or itching.  CARDIOVASCULAR: per hpi RESPIRATORY: No shortness of breath, cough or sputum.  GASTROINTESTINAL: No anorexia, nausea, vomiting or diarrhea. No abdominal pain or blood.  GENITOURINARY: No burning on urination, no polyuria NEUROLOGICAL: No headache, dizziness, syncope, paralysis, ataxia, numbness or tingling in the extremities. No change in bowel or bladder control.  MUSCULOSKELETAL: No muscle, back pain, joint pain or stiffness.  LYMPHATICS: No enlarged nodes. No history of splenectomy.  PSYCHIATRIC: No history of depression or anxiety.  ENDOCRINOLOGIC: No reports of sweating, cold or heat intolerance. No polyuria or polydipsia.  [name].   Physical Examination Vitals:   05/20/16 0835 05/20/16 0843  BP: 120/84 119/78  Pulse: 73 70   Vitals:   05/20/16 0835  Weight: 213 lb (96.6 kg)  Height: 5\' 8"  (1.727 m)    Gen: resting comfortably, no acute distress HEENT: no scleral icterus, pupils equal round and reactive, no palptable cervical adenopathy,  CV: RRR, no m/r/g, no jvd Resp: Clear to auscultation bilaterally GI: abdomen is soft, non-tender,  non-distended, normal bowel sounds, no hepatosplenomegaly MSK: extremities are warm, no edema.  Skin: warm, no rash Neuro:  no focal deficits Psych: appropriate affect    Assessment and Plan  1. PSVT - EKG in clinic today shows NSR, early repolarization - we will start lopressor 12.5mg  bid and follow symptoms - request labs from pcp, K was low in the ER.    F/u 6 weeks   James Snyder, M.D.

## 2016-05-20 NOTE — Patient Instructions (Signed)
Your physician recommends that you schedule a follow-up appointment in: [facility]EE[facility]  Your physician has recommended you make the following change in your medication:   DECREASE LOPRESSOR (METOPROLOL) 12.5 MG TWICE DAILY  Thank you for choosing [facility]!!

## 2016-05-24 ENCOUNTER — Other Ambulatory Visit: Payer: Self-pay | Admitting: Family Medicine

## 2016-05-24 DIAGNOSIS — J309 Allergic rhinitis, unspecified: Secondary | ICD-10-CM

## 2016-05-29 ENCOUNTER — Other Ambulatory Visit: Payer: Self-pay | Admitting: Pediatrics

## 2016-05-29 ENCOUNTER — Other Ambulatory Visit: Payer: Self-pay | Admitting: Family Medicine

## 2016-05-29 DIAGNOSIS — J3089 Other allergic rhinitis: Secondary | ICD-10-CM

## 2016-05-29 MED ORDER — AZELASTINE HCL 0.05 % OP SOLN: 1.0000 [drp] | Freq: Every day | OPHTHALMIC | 0 refills | Status: DC | PRN

## 2016-05-29 NOTE — Telephone Encounter (Signed)
TC to pt about refill of eye gtts He stated that he mentioned at last visit still having issues and provider refilled medication but he did not pick up the refill at that time

## 2016-05-29 NOTE — Telephone Encounter (Signed)
Needs to be seen

## 2016-05-29 NOTE — Telephone Encounter (Signed)
Spoke with pt, sent in allergy/antihistamine eye drops.

## 2016-07-01 ENCOUNTER — Encounter: Payer: Self-pay | Admitting: Cardiology

## 2016-07-01 ENCOUNTER — Ambulatory Visit (INDEPENDENT_AMBULATORY_CARE_PROVIDER_SITE_OTHER): Payer: BLUE CROSS/BLUE SHIELD | Admitting: Cardiology

## 2016-07-01 VITALS — BP 110/78 | HR 77 | Ht 69.0 in | Wt 214.0 lb

## 2016-07-01 DIAGNOSIS — I471 Supraventricular tachycardia, unspecified: Secondary | ICD-10-CM

## 2016-07-01 DIAGNOSIS — I10 Essential (primary) hypertension: Secondary | ICD-10-CM

## 2016-07-01 DIAGNOSIS — E876 Hypokalemia: Secondary | ICD-10-CM

## 2016-07-01 MED ORDER — LISINOPRIL 10 MG PO TABS: 10.0000 mg | ORAL_TABLET | Freq: Every day | ORAL | 3 refills | Status: DC

## 2016-07-01 NOTE — Patient Instructions (Addendum)
Medication Instructions:  Your physician has recommended you make the following change in your medication: STOP PRINZIDE  BEGIN LISINOPRIL 10 MG ONCE DAILY CONTINUE ALL OTHER MEDICATIONS AS PRESCRIBED   Labwork: BMET, MG  Testing/Procedures: NONE  Follow-Up: Your physician wants you to follow-up in: 1 YEAR WITH DR. BRANCH. You will receive a reminder letter in the mail two months in advance. If you don't receive a letter, please call our office to schedule the follow-up appointment.  Any Other Special Instructions Will Be Listed Below (If Applicable).  If you need a refill on your cardiac medications before your next appointment, please call your pharmacy.

## 2016-07-01 NOTE — Progress Notes (Signed)
Clinical Summary James Snyder is a 41 y.o.male seen today for follow up of the following medical problems.   1. 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 K 3.3, Mg 2.1, TSH 1.51 - reports prior palpitations, but this was most severe episode. More intense, SOB. Occurred while at church. Lightheaded/dizzy. Reports had some energy drinks around that time.   - can still have some palpitations. Overall mild and infrequency. Can have some orthostatic dizziness at times.     Past Medical History:  Diagnosis Date  . Allergy   . GERD (gastroesophageal reflux disease)      Allergies  Allergen Reactions  . Penicillins Hives     Current Outpatient Prescriptions  Medication Sig Dispense Refill  . azelastine (OPTIVAR) 0.05 % ophthalmic solution Place 1 drop into both eyes daily as needed. 6 mL 0  . cetirizine (ZYRTEC) 10 MG tablet TAKE 1 TABLET (10 MG TOTAL) BY MOUTH DAILY. 30 tablet 9  . fluticasone (FLONASE) 50 MCG/ACT nasal spray Place 2 sprays into both nostrils daily. 16 g 6  . lisinopril-hydrochlorothiazide (PRINZIDE,ZESTORETIC) 10-12.5 MG tablet Take 1 tablet by mouth daily. 90 tablet 3  . metoprolol tartrate (LOPRESSOR) 25 MG tablet Take 0.5 tablets (12.5 mg total) by mouth 2 (two) times daily. 90 tablet 1  . omeprazole (PRILOSEC) 40 MG capsule TAKE 1 CAPSULE (40 MG TOTAL) BY MOUTH DAILY. 30 capsule 5  . pravastatin (PRAVACHOL) 40 MG tablet Take 1 tablet (40 mg total) by mouth daily. 90 tablet 0  . sildenafil (VIAGRA) 100 MG tablet TAKE 1 TABLET (50 MG TOTAL) BY MOUTH DAILY AS NEEDED FOR ERECTILE DYSFUNCTION. 15 tablet 2  . tadalafil (CIALIS) 5 MG tablet Take 1 tablet (5 mg total) by mouth daily. 30 tablet 3   No current facility-administered medications for this visit.      Past Surgical History:  Procedure Laterality Date  . NO PAST SURGERIES       Allergies  Allergen Reactions  . Penicillins Hives      Family  History  Problem Relation Age of Onset  . Cancer Mother   . Heart disease Father   . Stroke Brother      Social History Mr. Wirt reports that he has quit smoking. His smoking use included Cigars. He has quit using smokeless tobacco. Mr. Kamau reports that he does not drink alcohol.   Review of Systems CONSTITUTIONAL: No weight loss, fever, chills, weakness or fatigue.  HEENT: Eyes: No visual loss, blurred vision, double vision or yellow sclerae.No hearing loss, sneezing, congestion, runny nose or sore throat.  SKIN: No rash or itching.  CARDIOVASCULAR: per hpi RESPIRATORY: No shortness of breath, cough or sputum.  GASTROINTESTINAL: No anorexia, nausea, vomiting or diarrhea. No abdominal pain or blood.  GENITOURINARY: No burning on urination, no polyuria NEUROLOGICAL: occasional dizziness MUSCULOSKELETAL: No muscle, back pain, joint pain or stiffness.  LYMPHATICS: No enlarged nodes. No history of splenectomy.  PSYCHIATRIC: No history of depression or anxiety.  ENDOCRINOLOGIC: No reports of sweating, cold or heat intolerance. No polyuria or polydipsia.  [name]   Physical Examination Vitals:   07/01/16 0820  BP: 110/78  Pulse: 77   Vitals:   07/01/16 0820  Weight: 214 lb (97.1 kg)  Height: 5\' 9"  (1.753 m)    Gen: resting comfortably, no acute distress HEENT: no scleral icterus, pupils equal round and reactive, no palptable cervical adenopathy,  CV: RRR, no m/rg, no jvd  Resp: Clear to auscultation bilaterally GI: abdomen is soft, non-tender, non-distended, normal bowel sounds, no hepatosplenomegaly MSK: extremities are warm, no edema.  Skin: warm, no rash Neuro:  no focal deficits Psych: appropriate affect       Assessment and Plan  1. PSVT - symptoms overall controlled on lopressor, continue current meds  2. HTN - orthostatic symptoms at times, we will d/c HCTZ  3. Hypokalemia - we will repeat labs - d/c HCTZ as described above.    F/u 1 year   [name]  F. Taelar Gronewold, M.D.

## 2016-07-03 ENCOUNTER — Other Ambulatory Visit: Payer: Self-pay | Admitting: Pediatrics

## 2016-07-03 DIAGNOSIS — J3089 Other allergic rhinitis: Secondary | ICD-10-CM

## 2016-08-09 ENCOUNTER — Other Ambulatory Visit: Payer: Self-pay | Admitting: Family Medicine

## 2016-09-28 ENCOUNTER — Other Ambulatory Visit: Payer: Self-pay | Admitting: Pediatrics

## 2016-09-28 DIAGNOSIS — K219 Gastro-esophageal reflux disease without esophagitis: Secondary | ICD-10-CM

## 2016-10-08 ENCOUNTER — Encounter: Payer: Self-pay | Admitting: Pediatrics

## 2016-10-08 ENCOUNTER — Ambulatory Visit (INDEPENDENT_AMBULATORY_CARE_PROVIDER_SITE_OTHER): Payer: BLUE CROSS/BLUE SHIELD | Admitting: Pediatrics

## 2016-10-08 ENCOUNTER — Other Ambulatory Visit: Payer: Self-pay

## 2016-10-08 VITALS — BP 118/83 | HR 89 | Temp 98.3°F | Ht 69.0 in | Wt 210.0 lb

## 2016-10-08 DIAGNOSIS — N529 Male erectile dysfunction, unspecified: Secondary | ICD-10-CM

## 2016-10-08 DIAGNOSIS — I10 Essential (primary) hypertension: Secondary | ICD-10-CM

## 2016-10-08 DIAGNOSIS — S70361A Insect bite (nonvenomous), right thigh, initial encounter: Secondary | ICD-10-CM | POA: Diagnosis not present

## 2016-10-08 DIAGNOSIS — W57XXXA Bitten or stung by nonvenomous insect and other nonvenomous arthropods, initial encounter: Secondary | ICD-10-CM

## 2016-10-08 MED ORDER — LISINOPRIL 10 MG PO TABS: 10.0000 mg | ORAL_TABLET | Freq: Every day | ORAL | 3 refills | Status: DC

## 2016-10-08 MED ORDER — METOPROLOL TARTRATE 25 MG PO TABS: 12.5000 mg | ORAL_TABLET | Freq: Two times a day (BID) | ORAL | 1 refills | Status: DC

## 2016-10-08 MED ORDER — HYDROCORTISONE 2.5 % EX OINT: TOPICAL_OINTMENT | Freq: Two times a day (BID) | CUTANEOUS | 0 refills | Status: DC

## 2016-10-08 MED ORDER — DOXYCYCLINE HYCLATE 100 MG PO TABS: 100.0000 mg | ORAL_TABLET | Freq: Two times a day (BID) | ORAL | 0 refills | Status: DC

## 2016-10-08 MED ORDER — SILDENAFIL CITRATE 100 MG PO TABS: ORAL_TABLET | ORAL | 2 refills | Status: DC

## 2016-10-08 NOTE — Progress Notes (Signed)
  Subjective:   Patient ID: James Snyder, male    DOB: [birth year], 41 y.o.   MRN: [id number] CC: Insect Bite (Tick)  HPI: James Snyder is a 41 y.o. male presenting for Insect Bite (Tick)  Noticed a tick about 3 days ago Since then has had some itching in area Had a rash surrounding it, has improved  Had a tick bite about a month ago Now with decreased appetite Subjective fevers starting about a week ago Pain in feet and knees started abou tthe same time Appetite has been down No nausea No abd pain  HTN: no CP Episode of SVT, treated in ED Following with cardiology Now on metoprolol Taking meds regularly   Still with some ED symptoms viagra helps some   Elevated BMI Working on decreasing weight, pleased with success  Relevant past medical, surgical, family and social history reviewed. Allergies and medications reviewed and updated. History  Smoking Status  . Former Smoker  . Types: Cigars  Smokeless Tobacco  . Former Neurosurgeon    Comment: every once in a while   ROS: Per HPI   Objective:    BP 118/83   Pulse 89   Temp 98.3 F (36.8 C) (Oral)   Ht  (1.753 m)   Wt 210 lb (95.3 kg)   BMI 31.01 kg/m   Wt Readings from Last 3 Encounters:  10/08/16 210 lb (95.3 kg)  07/01/16 214 lb (97.1 kg)  05/20/16 213 lb (96.6 kg)    Gen: NAD, alert, cooperative with exam, NCAT EYES: EOMI, no conjunctival injection, or no icterus ENT: OP without erythema CV: NRRR, normal S1/S2, no murmur, distal pulses 2+ b/l Resp: CTABL, no wheezes, normal WOB Ext: No edema, warm Neuro: Alert and oriented, strength equal b/l UE and LE, coordination grossly normal MSK: normal muscle bulk Skin: several hyperpigmented macules R thigh No surrounding induration  Assessment & Plan:  Bern was seen today for insect bite.  Diagnoses and all orders for this visit:  Tick bite, initial encounter Treat with below given fevers, joint pains -     doxycycline (VIBRA-TABS) 100 MG tablet;  Take 1 tablet (100 mg total) by mouth 2 (two) times daily. -     hydrocortisone 2.5 % ointment; Apply topically 2 (two) times daily.  Erectile dysfunction, unspecified erectile dysfunction type Stable, cont below prn -     sildenafil (VIAGRA) 100 MG tablet; TAKE 1 TABLET (100 MG TOTAL) BY MOUTH DAILY AS NEEDED FOR ERECTILE DYSFUNCTION.  Essential hypertension Well controlled, cont below -     lisinopril (PRINIVIL,ZESTRIL) 10 MG tablet; Take 1 tablet (10 mg total) by mouth daily. -     metoprolol tartrate (LOPRESSOR) 25 MG tablet; Take 0.5 tablets (12.5 mg total) by mouth 2 (two) times daily. -     Basic metabolic panel -     Magnesium   Follow up plan: Return in about 6 months (around 04/07/2017). James Kras, MD James Snyder [facility]

## 2016-10-09 LAB — BASIC METABOLIC PANEL
BUN/Creatinine Ratio: 8 — ABNORMAL LOW (ref 9–20)
BUN: 8 mg/dL (ref 6–24)
CHLORIDE: 100 mmol/L (ref 96–106)
CO2: 24 mmol/L (ref 20–29)
Calcium: 9.2 mg/dL (ref 8.7–10.2)
Creatinine, Ser: 0.96 mg/dL (ref 0.76–1.27)
GFR calc Af Amer: 112 mL/min/{1.73_m2} (ref >59)
GFR calc non Af Amer: 97 mL/min/{1.73_m2} (ref >59)
GLUCOSE: 105 mg/dL — AB (ref 65–99)
POTASSIUM: 4.2 mmol/L (ref 3.5–5.2)
SODIUM: 139 mmol/L (ref 134–144)

## 2016-10-09 LAB — MAGNESIUM: Magnesium: 2.1 mg/dL (ref 1.6–2.3)

## 2016-10-24 ENCOUNTER — Telehealth: Payer: Self-pay | Admitting: *Deleted

## 2016-10-24 NOTE — Telephone Encounter (Signed)
LM of normal results as per DPR - routed to pcp

## 2016-10-24 NOTE — Telephone Encounter (Signed)
-----   Message from Fonnie Birkenhead, [place] sent at 10/21/2016  1:44 PM EDT -----   ----- Message ----- From: Antoine Poche, MD Sent: 10/21/2016  12:36 PM To: Fonnie Birkenhead, CMA  Labs look good  Dominga Ferry MD

## 2016-10-28 ENCOUNTER — Other Ambulatory Visit: Payer: Self-pay | Admitting: Family

## 2016-10-28 DIAGNOSIS — K219 Gastro-esophageal reflux disease without esophagitis: Secondary | ICD-10-CM

## 2016-10-28 DIAGNOSIS — E785 Hyperlipidemia, unspecified: Secondary | ICD-10-CM

## 2016-11-24 ENCOUNTER — Other Ambulatory Visit: Payer: Self-pay | Admitting: Family

## 2016-11-24 ENCOUNTER — Other Ambulatory Visit: Payer: Self-pay | Admitting: Pediatrics

## 2016-11-24 DIAGNOSIS — E785 Hyperlipidemia, unspecified: Secondary | ICD-10-CM

## 2016-11-25 NOTE — Telephone Encounter (Signed)
Last lipid 02/23/16  Dr Oswaldo DoneVincent

## 2016-12-24 ENCOUNTER — Other Ambulatory Visit: Payer: Self-pay | Admitting: Pediatrics

## 2016-12-24 DIAGNOSIS — K219 Gastro-esophageal reflux disease without esophagitis: Secondary | ICD-10-CM

## 2016-12-30 ENCOUNTER — Ambulatory Visit (INDEPENDENT_AMBULATORY_CARE_PROVIDER_SITE_OTHER): Payer: BLUE CROSS/BLUE SHIELD

## 2016-12-30 DIAGNOSIS — Z23 Encounter for immunization: Secondary | ICD-10-CM

## 2017-01-21 ENCOUNTER — Other Ambulatory Visit: Payer: Self-pay | Admitting: *Deleted

## 2017-01-21 DIAGNOSIS — K219 Gastro-esophageal reflux disease without esophagitis: Secondary | ICD-10-CM

## 2017-01-21 MED ORDER — OMEPRAZOLE 40 MG PO CPDR: DELAYED_RELEASE_CAPSULE | ORAL | 0 refills | Status: DC

## 2017-01-22 ENCOUNTER — Other Ambulatory Visit: Payer: Self-pay

## 2017-01-22 DIAGNOSIS — J309 Allergic rhinitis, unspecified: Secondary | ICD-10-CM

## 2017-01-22 MED ORDER — CETIRIZINE HCL 10 MG PO TABS: 10.0000 mg | ORAL_TABLET | Freq: Every day | ORAL | 1 refills | Status: DC

## 2017-03-18 ENCOUNTER — Encounter: Payer: Self-pay | Admitting: Family Medicine

## 2017-03-18 ENCOUNTER — Ambulatory Visit: Payer: BLUE CROSS/BLUE SHIELD | Admitting: Family Medicine

## 2017-03-18 VITALS — BP 132/84 | HR 99 | Temp 98.6°F | Ht 69.0 in | Wt 212.0 lb

## 2017-03-18 DIAGNOSIS — Z202 Contact with and (suspected) exposure to infections with a predominantly sexual mode of transmission: Secondary | ICD-10-CM | POA: Diagnosis not present

## 2017-03-18 NOTE — Progress Notes (Signed)
BP 132/84   Pulse 99   Temp 98.6 F (37 C) (Oral)   Ht 5\' 9"  (1.753 m)   Wt 212 lb (96.2 kg)   BMI 31.31 kg/m    Subjective:    Patient ID: James Snyder, male    DOB: 06/06/1975, 42 y.o.   MRN: [id number]  HPI: James MundaBradley R Snedden is a 42 y.o. male presenting on 03/18/2017 for STD testing   HPI Possible STD exposure Patient is coming in today for possible STD exposure.  He says his wife called him last week that she was diagnosed positive with trichomonas and that he needed to get treatment.  He says that she had been cheating on him and they are split up now because of it.  He says that he has some sporadic testicular soreness but nothing severe.  He denies any rashes or sores.  He has had a little bit of penile discharge that has been clear and nonbloody.  He has a small amount of dysuria but denies any fevers or chills or body aches.  He denies any abdominal pain or flank pain.  He denies any blood in his urine.  Relevant past medical, surgical, family and social history reviewed and updated as indicated. Interim medical history since our last visit reviewed. Allergies and medications reviewed and updated.  Review of Systems  Constitutional: Negative for chills and fever.  Respiratory: Negative for shortness of breath and wheezing.   Cardiovascular: Negative for chest pain and leg swelling.  Gastrointestinal: Negative for abdominal pain.  Genitourinary: Positive for discharge and testicular pain. Negative for dysuria, flank pain, hematuria, penile pain and scrotal swelling.  Musculoskeletal: Negative for back pain and gait problem.  Skin: Negative for rash.  All other systems reviewed and are negative.   Per HPI unless specifically indicated above        Objective:    BP 132/84   Pulse 99   Temp 98.6 F (37 C) (Oral)   Ht 5\' 9"  (1.753 m)   Wt 212 lb (96.2 kg)   BMI 31.31 kg/m   Wt Readings from Last 3 Encounters:  03/18/17 212 lb (96.2 kg)  10/08/16 210 lb (95.3  kg)  07/01/16 214 lb (97.1 kg)    Physical Exam  Constitutional: He is oriented to person, place, and time. He appears well-developed and well-nourished. No distress.  Eyes: Conjunctivae are normal. No scleral icterus.  Abdominal: Hernia confirmed negative in the right inguinal area and confirmed negative in the left inguinal area.  Genitourinary: Right testis shows no mass, no swelling and no tenderness. Left testis shows tenderness (Mild tenderness near spermatic cord). Left testis shows no mass and no swelling. Circumcised. No discharge found.  Lymphadenopathy:       Right: No inguinal adenopathy present.       Left: No inguinal adenopathy present.  Neurological: He is alert and oriented to person, place, and time. Coordination normal.  Skin: Skin is warm and dry. No rash noted. He is not diaphoretic.  Psychiatric: He has a normal mood and affect. His behavior is normal.  Nursing note and vitals reviewed.       Assessment & Plan:   Problem List Items Addressed This Visit    None    Visit Diagnoses    Exposure to sexually transmitted disease (STD)    -  Primary   Relevant Orders   STD Screen (8)   Chlamydia/Gonococcus/Trichomonas, NAA       Follow up plan:  Return if symptoms worsen or fail to improve.  Counseling provided for all of the vaccine components Orders Placed This Encounter  Procedures  . Chlamydia/Gonococcus/Trichomonas, NAA  . STD Screen (8)    Arville Care, MD [facility] 03/18/2017, 2:39 PM

## 2017-03-19 LAB — STD SCREEN (8)
HIV Screen 4th Generation wRfx: NONREACTIVE
HSV 2 IGG, TYPE SPEC: 19.8 {index} — AB (ref 0.00–0.90)
Hep A IgM: NEGATIVE
Hep B C IgM: NEGATIVE
Hepatitis B Surface Ag: NEGATIVE
RPR: NONREACTIVE

## 2017-03-19 LAB — CHLAMYDIA/GONOCOCCUS/TRICHOMONAS, NAA
Chlamydia by NAA: NEGATIVE
Gonococcus by NAA: NEGATIVE
TRICH VAG BY NAA: NEGATIVE

## 2017-03-20 ENCOUNTER — Telehealth: Payer: Self-pay | Admitting: Pediatrics

## 2017-03-20 ENCOUNTER — Telehealth: Payer: Self-pay | Admitting: Family Medicine

## 2017-03-20 MED ORDER — VALACYCLOVIR HCL 1 G PO TABS: 1000.0000 mg | ORAL_TABLET | Freq: Two times a day (BID) | ORAL | 0 refills | Status: DC

## 2017-03-20 MED ORDER — METRONIDAZOLE 500 MG PO TABS
500.0000 mg | ORAL_TABLET | Freq: Three times a day (TID) | ORAL | 0 refills | Status: DC
Start: 2017-03-20 — End: 2017-12-31

## 2017-03-20 MED ORDER — VALACYCLOVIR HCL 1 G PO TABS: 1000.0000 mg | ORAL_TABLET | Freq: Every day | ORAL | 11 refills | Status: DC

## 2017-03-20 NOTE — Telephone Encounter (Signed)
Discussed results of labs with patient and he is understanding, sent in the Valtrex and metronidazole for Trichomonas exposure. Arville CareJoshua Dettinger, MD [facility] 03/20/2017, 3:52 PM

## 2017-03-21 NOTE — Telephone Encounter (Signed)
Spoke with patient about test results and sent medication

## 2017-03-29 ENCOUNTER — Other Ambulatory Visit: Payer: Self-pay | Admitting: Pediatrics

## 2017-03-29 DIAGNOSIS — K219 Gastro-esophageal reflux disease without esophagitis: Secondary | ICD-10-CM

## 2017-04-23 ENCOUNTER — Other Ambulatory Visit: Payer: Self-pay | Admitting: Pediatrics

## 2017-04-23 DIAGNOSIS — K219 Gastro-esophageal reflux disease without esophagitis: Secondary | ICD-10-CM

## 2017-04-27 ENCOUNTER — Other Ambulatory Visit: Payer: Self-pay | Admitting: Pediatrics

## 2017-04-27 DIAGNOSIS — I10 Essential (primary) hypertension: Secondary | ICD-10-CM

## 2017-05-01 ENCOUNTER — Other Ambulatory Visit: Payer: Self-pay | Admitting: Pediatrics

## 2017-05-01 DIAGNOSIS — J069 Acute upper respiratory infection, unspecified: Secondary | ICD-10-CM

## 2017-07-22 ENCOUNTER — Other Ambulatory Visit: Payer: Self-pay | Admitting: Pediatrics

## 2017-07-22 DIAGNOSIS — J309 Allergic rhinitis, unspecified: Secondary | ICD-10-CM

## 2017-07-22 DIAGNOSIS — E785 Hyperlipidemia, unspecified: Secondary | ICD-10-CM

## 2017-07-22 DIAGNOSIS — K219 Gastro-esophageal reflux disease without esophagitis: Secondary | ICD-10-CM

## 2017-07-23 NOTE — Telephone Encounter (Signed)
Last seen 03/18/17  Last lipid 02/23/16

## 2017-09-08 ENCOUNTER — Other Ambulatory Visit: Payer: Self-pay | Admitting: Pediatrics

## 2017-09-08 DIAGNOSIS — N529 Male erectile dysfunction, unspecified: Secondary | ICD-10-CM

## 2017-09-08 NOTE — Telephone Encounter (Signed)
Last seen 03/18/17

## 2017-10-04 ENCOUNTER — Other Ambulatory Visit: Payer: Self-pay | Admitting: Pediatrics

## 2017-10-04 DIAGNOSIS — J309 Allergic rhinitis, unspecified: Secondary | ICD-10-CM

## 2017-10-04 DIAGNOSIS — K219 Gastro-esophageal reflux disease without esophagitis: Secondary | ICD-10-CM

## 2017-11-02 ENCOUNTER — Other Ambulatory Visit: Payer: Self-pay | Admitting: Pediatrics

## 2017-11-02 DIAGNOSIS — K219 Gastro-esophageal reflux disease without esophagitis: Secondary | ICD-10-CM

## 2017-11-02 DIAGNOSIS — J309 Allergic rhinitis, unspecified: Secondary | ICD-10-CM

## 2017-11-04 NOTE — Telephone Encounter (Signed)
Last seen 03/18/17

## 2017-11-10 ENCOUNTER — Other Ambulatory Visit: Payer: Self-pay | Admitting: Family Medicine

## 2017-11-10 DIAGNOSIS — I10 Essential (primary) hypertension: Secondary | ICD-10-CM

## 2017-11-10 NOTE — Telephone Encounter (Signed)
Last seen 3 5 19   Dr Oswaldo Done

## 2017-11-11 ENCOUNTER — Telehealth: Payer: Self-pay | Admitting: Pediatrics

## 2017-11-11 ENCOUNTER — Other Ambulatory Visit: Payer: Self-pay | Admitting: Pediatrics

## 2017-11-11 DIAGNOSIS — I10 Essential (primary) hypertension: Secondary | ICD-10-CM

## 2017-12-31 ENCOUNTER — Encounter: Payer: Self-pay | Admitting: Family Medicine

## 2017-12-31 ENCOUNTER — Ambulatory Visit (INDEPENDENT_AMBULATORY_CARE_PROVIDER_SITE_OTHER): Payer: PRIVATE HEALTH INSURANCE | Admitting: Family Medicine

## 2017-12-31 VITALS — BP 136/90 | HR 108 | Temp 97.3°F | Ht 69.0 in | Wt 198.0 lb

## 2017-12-31 DIAGNOSIS — Z0001 Encounter for general adult medical examination with abnormal findings: Secondary | ICD-10-CM | POA: Diagnosis not present

## 2017-12-31 DIAGNOSIS — Z0000 Encounter for general adult medical examination without abnormal findings: Secondary | ICD-10-CM

## 2017-12-31 DIAGNOSIS — I10 Essential (primary) hypertension: Secondary | ICD-10-CM

## 2017-12-31 DIAGNOSIS — Z8639 Personal history of other endocrine, nutritional and metabolic disease: Secondary | ICD-10-CM | POA: Diagnosis not present

## 2017-12-31 DIAGNOSIS — Z6829 Body mass index (BMI) 29.0-29.9, adult: Secondary | ICD-10-CM

## 2017-12-31 DIAGNOSIS — R7303 Prediabetes: Secondary | ICD-10-CM

## 2017-12-31 DIAGNOSIS — K580 Irritable bowel syndrome with diarrhea: Secondary | ICD-10-CM

## 2017-12-31 LAB — BAYER DCA HB A1C WAIVED: HB A1C: 6 % (ref <7.0)

## 2017-12-31 MED ORDER — RIFAXIMIN 200 MG PO TABS: 200.0000 mg | ORAL_TABLET | Freq: Three times a day (TID) | ORAL | 0 refills | Status: DC

## 2017-12-31 MED ORDER — ENALAPRIL MALEATE 2.5 MG PO TABS: 2.5000 mg | ORAL_TABLET | Freq: Every day | ORAL | 3 refills | Status: DC

## 2017-12-31 NOTE — Progress Notes (Signed)
BP 136/90   Pulse (!) 108   Temp (!) 97.3 F (36.3 C) (Oral)   Ht _0  (1.753 m)   Wt 198 lb (89.8 kg)   BMI 29.24 kg/m    Subjective:    Patient ID: James Snyder, male    DOB: 03/19/1975, 42 y.o.   MRN: [id number]  HPI: ADIT RIDDLES is a 42 y.o. male presenting on 12/31/2017 for Annual Exam   HPI Well adult exam and physical Patient is coming in today for well adult exam and physical and recheck on chronic issues.  He has been losing weight and feels like his blood sugars are no longer an issue which they will looked great last time he was here.  His blood pressure is 136/90 and he has been keeping an eye on it and still taking the lisinopril.  He also takes metoprolol.  Patient has some concerns about lisinopril that he is heard on recalls and other things.  The only other issue that he complains of recently is that he still having the diarrhea.  He was having diarrhea for at least the past almost 5 months.  He was taking the Flagyl for treatment for trichomonas and the diarrhea was improved when he was on it but then came right back afterwards.  He has 1-2 episodes of loose stools per day.  He denies any nausea vomiting or abdominal pain or blood. Patient denies any chest pain, shortness of breath, headaches or vision issues, abdominal complaints, nausea, vomiting, or joint issues.   Relevant past medical, surgical, family and social history reviewed and updated as indicated. Interim medical history since our last visit reviewed. Allergies and medications reviewed and updated.  Review of Systems  Constitutional: Negative for chills and fever.  HENT: Negative for ear pain and tinnitus.   Eyes: Negative for pain and visual disturbance.  Respiratory: Negative for cough, shortness of breath and wheezing.   Cardiovascular: Negative for chest pain, palpitations and leg swelling.  Gastrointestinal: Positive for diarrhea. Negative for abdominal pain, blood in stool, constipation,  nausea and vomiting.  Genitourinary: Negative for dysuria and hematuria.  Musculoskeletal: Negative for back pain, gait problem and myalgias.  Skin: Negative for rash.  Neurological: Negative for dizziness, weakness and headaches.  Psychiatric/Behavioral: Negative for suicidal ideas.  All other systems reviewed and are negative.   Per HPI unless specifically indicated above   Allergies as of 12/31/2017      Reactions   Penicillins Hives      Medication List       Accurate as of December 31, 2017  2:58 PM. Always use your most recent med list.        azelastine 0.05 % ophthalmic solution Commonly known as:  OPTIVAR PLACE 1 DROP INTO BOTH EYES DAILY AS NEEDED.   cetirizine 10 MG tablet Commonly known as:  ZYRTEC TAKE 1 TABLET BY MOUTH EVERY DAY   fluticasone 50 MCG/ACT nasal spray Commonly known as:  FLONASE PLACE 2 SPRAYS INTO BOTH NOSTRILS DAILY.   hydrocortisone 2.5 % ointment Apply topically 2 (two) times daily.   lisinopril 10 MG tablet Commonly known as:  PRINIVIL,ZESTRIL TAKE 1 TABLET BY MOUTH EVERY DAY   metoprolol tartrate 25 MG tablet Commonly known as:  LOPRESSOR TAKE 1/2 TABLET (12.5 MG TOTAL) BY MOUTH 2 (TWO) TIMES DAILY.   omeprazole 40 MG capsule Commonly known as:  PRILOSEC TAKE 1 CAPSULE BY MOUTH EVERY DAY   pravastatin 40 MG tablet Commonly known as:  PRAVACHOL TAKE 1 TABLET BY MOUTH EVERY DAY   rifaximin 200 MG tablet Commonly known as:  XIFAXAN Take 1 tablet (200 mg total) by mouth 3 (three) times daily.   sildenafil 100 MG tablet Commonly known as:  VIAGRA TAKE 1 TABLET BY MOUTH EVERY DAY AS NEEDED   valACYclovir 1000 MG tablet Commonly known as:  VALTREX Take 1 tablet (1,000 mg total) by mouth 2 (two) times daily.   valACYclovir 1000 MG tablet Commonly known as:  VALTREX Take 1 tablet (1,000 mg total) by mouth daily.          Objective:    BP 136/90   Pulse (!) 108   Temp (!) 97.3 F (36.3 C) (Oral)   Ht _0  (1.753  m)   Wt 198 lb (89.8 kg)   BMI 29.24 kg/m   Wt Readings from Last 3 Encounters:  12/31/17 198 lb (89.8 kg)  03/18/17 212 lb (96.2 kg)  10/08/16 210 lb (95.3 kg)    Physical Exam Vitals signs and nursing note reviewed.  Constitutional:      General: He is not in acute distress.    Appearance: He is well-developed. He is not diaphoretic.  HENT:     Right Ear: External ear normal.     Left Ear: External ear normal.     Nose: Nose normal.     Mouth/Throat:     Pharynx: No oropharyngeal exudate.  Eyes:     General: No scleral icterus.    Conjunctiva/sclera: Conjunctivae normal.  Neck:     Musculoskeletal: Neck supple.     Thyroid: No thyromegaly.  Cardiovascular:     Rate and Rhythm: Normal rate and regular rhythm.     Heart sounds: Normal heart sounds. No murmur.  Pulmonary:     Effort: Pulmonary effort is normal. No respiratory distress.     Breath sounds: Normal breath sounds. No wheezing.  Abdominal:     General: Bowel sounds are normal. There is no distension.     Palpations: Abdomen is soft.     Tenderness: There is no abdominal tenderness. There is no guarding or rebound.  Musculoskeletal: Normal range of motion.  Lymphadenopathy:     Cervical: No cervical adenopathy.  Skin:    General: Skin is warm and dry.     Findings: No rash.  Neurological:     Mental Status: He is alert and oriented to person, place, and time.     Coordination: Coordination normal.  Psychiatric:        Behavior: Behavior normal.         Assessment & Plan:   Problem List Items Addressed This Visit      Cardiovascular and Mediastinum   Essential hypertension   Relevant Medications   enalapril (VASOTEC) 2.5 MG tablet   Other Relevant Orders   CMP14+EGFR     Other   H/O hyperlipidemia   Relevant Orders   Lipid panel   BMI 29.0-29.9,adult   Relevant Orders   CBC with Differential/Platelet   Pre-diabetes   Relevant Orders   Bayer DCA Hb A1c Waived    Other Visit Diagnoses      Well adult exam    -  Primary   Relevant Orders   Bayer DCA Hb A1c Waived   CMP14+EGFR   CBC with Differential/Platelet   Lipid panel   Irritable bowel syndrome with diarrhea       Relevant Medications   rifaximin (XIFAXAN) 200 MG tablet  Patient has IBS D that is been having since his last 5 months.  We will try a short course of Xifaxan, he said it did get better when he was on the Flagyl for short period.  Patient will continue to monitor blood pressures Follow up plan: Return in about 1 year (around 01/01/2019), or if symptoms worsen or fail to improve, for Physical and recheck of hypertension cholesterol.  Counseling provided for all of the vaccine components Orders Placed This Encounter  Procedures  . Bayer DCA Hb A1c Waived  . CMP14+EGFR  . CBC with Differential/Platelet  . Lipid panel    Caryl Pina, MD [facility] 12/31/2017, 2:58 PM

## 2018-01-01 LAB — CBC WITH DIFFERENTIAL/PLATELET
Basophils Absolute: 0 10*3/uL (ref 0.0–0.2)
Basos: 0 %
EOS (ABSOLUTE): 0.1 10*3/uL (ref 0.0–0.4)
Eos: 1 %
Hematocrit: 40.8 % (ref 37.5–51.0)
Hemoglobin: 14.5 g/dL (ref 13.0–17.7)
Immature Grans (Abs): 0 10*3/uL (ref 0.0–0.1)
Immature Granulocytes: 0 %
LYMPHS ABS: 2.6 10*3/uL (ref 0.7–3.1)
Lymphs: 36 %
MCH: 29.8 pg (ref 26.6–33.0)
MCHC: 35.5 g/dL (ref 31.5–35.7)
MCV: 84 fL (ref 79–97)
MONOCYTES: 11 %
Monocytes Absolute: 0.8 10*3/uL (ref 0.1–0.9)
Neutrophils Absolute: 3.7 10*3/uL (ref 1.4–7.0)
Neutrophils: 52 %
Platelets: 263 10*3/uL (ref 150–450)
RBC: 4.86 x10E6/uL (ref 4.14–5.80)
RDW: 15.2 % (ref 12.3–15.4)
WBC: 7.2 10*3/uL (ref 3.4–10.8)

## 2018-01-01 LAB — CMP14+EGFR
ALT: 26 IU/L (ref 0–44)
AST: 21 IU/L (ref 0–40)
Albumin/Globulin Ratio: 1.5 (ref 1.2–2.2)
Albumin: 4.1 g/dL (ref 3.5–5.5)
Alkaline Phosphatase: 54 IU/L (ref 39–117)
BUN/Creatinine Ratio: 9 (ref 9–20)
BUN: 8 mg/dL (ref 6–24)
Bilirubin Total: 0.3 mg/dL (ref 0.0–1.2)
CALCIUM: 9 mg/dL (ref 8.7–10.2)
CO2: 22 mmol/L (ref 20–29)
CREATININE: 0.86 mg/dL (ref 0.76–1.27)
Chloride: 104 mmol/L (ref 96–106)
GFR calc Af Amer: 123 mL/min/{1.73_m2} (ref >59)
GFR, EST NON AFRICAN AMERICAN: 106 mL/min/{1.73_m2} (ref >59)
Globulin, Total: 2.8 g/dL (ref 1.5–4.5)
Glucose: 95 mg/dL (ref 65–99)
POTASSIUM: 4 mmol/L (ref 3.5–5.2)
Sodium: 138 mmol/L (ref 134–144)
TOTAL PROTEIN: 6.9 g/dL (ref 6.0–8.5)

## 2018-01-01 LAB — LIPID PANEL
Chol/HDL Ratio: 4.8 ratio (ref 0.0–5.0)
Cholesterol, Total: 181 mg/dL (ref 100–199)
HDL: 38 mg/dL — ABNORMAL LOW (ref >39)
LDL CALC: 94 mg/dL (ref 0–99)
Triglycerides: 246 mg/dL — ABNORMAL HIGH (ref 0–149)
VLDL Cholesterol Cal: 49 mg/dL — ABNORMAL HIGH (ref 5–40)

## 2018-01-02 ENCOUNTER — Telehealth: Payer: Self-pay

## 2018-01-02 NOTE — Telephone Encounter (Signed)
Insurance denied prior [place] for [employer]

## 2018-01-02 NOTE — Telephone Encounter (Signed)
Did they give any alternatives that they recommended at this point.

## 2018-01-02 NOTE — Telephone Encounter (Signed)
Forward to Dr. Louanne Skyeettinger

## 2018-01-02 NOTE — Telephone Encounter (Signed)
Can we just call that Xifaxan drug representative and see if she can just give him 2 weeks worth to try

## 2018-01-02 NOTE — Telephone Encounter (Signed)
No they did not

## 2018-01-09 ENCOUNTER — Telehealth: Payer: Self-pay | Admitting: Pediatrics

## 2018-01-12 MED ORDER — METRONIDAZOLE 500 MG PO TABS: 500.0000 mg | ORAL_TABLET | Freq: Two times a day (BID) | ORAL | 0 refills | Status: DC

## 2018-01-12 NOTE — Telephone Encounter (Signed)
Patient agreeable to taking Metronidazole 500 mg.  [facility] [place]

## 2018-01-12 NOTE — Telephone Encounter (Signed)
Patient would like to try another medicine that his insurance will cover.  Please advise.

## 2018-01-12 NOTE — Telephone Encounter (Signed)
Unfortunately there is not a good replacement, we could try a short course of the Flagyl which we can send in for him, metronidazole 500 twice daily for 2 weeks but there is no equivalent to Xifaxan currently

## 2018-02-01 ENCOUNTER — Other Ambulatory Visit: Payer: Self-pay | Admitting: Pediatrics

## 2018-02-01 DIAGNOSIS — J309 Allergic rhinitis, unspecified: Secondary | ICD-10-CM

## 2018-02-01 DIAGNOSIS — E785 Hyperlipidemia, unspecified: Secondary | ICD-10-CM

## 2018-02-01 DIAGNOSIS — K219 Gastro-esophageal reflux disease without esophagitis: Secondary | ICD-10-CM

## 2018-02-09 ENCOUNTER — Other Ambulatory Visit: Payer: Self-pay | Admitting: Family

## 2018-02-09 DIAGNOSIS — I10 Essential (primary) hypertension: Secondary | ICD-10-CM

## 2018-03-09 ENCOUNTER — Other Ambulatory Visit: Payer: Self-pay | Admitting: Pediatrics

## 2018-03-09 DIAGNOSIS — N529 Male erectile dysfunction, unspecified: Secondary | ICD-10-CM

## 2018-03-14 ENCOUNTER — Other Ambulatory Visit: Payer: Self-pay | Admitting: Pediatrics

## 2018-03-14 DIAGNOSIS — E785 Hyperlipidemia, unspecified: Secondary | ICD-10-CM

## 2018-03-18 ENCOUNTER — Other Ambulatory Visit: Payer: Self-pay | Admitting: Pediatrics

## 2018-03-18 DIAGNOSIS — I10 Essential (primary) hypertension: Secondary | ICD-10-CM

## 2018-03-22 ENCOUNTER — Other Ambulatory Visit: Payer: Self-pay | Admitting: Pediatrics

## 2018-03-22 DIAGNOSIS — I10 Essential (primary) hypertension: Secondary | ICD-10-CM

## 2018-03-27 ENCOUNTER — Other Ambulatory Visit: Payer: Self-pay | Admitting: Pediatrics

## 2018-03-27 DIAGNOSIS — I10 Essential (primary) hypertension: Secondary | ICD-10-CM

## 2018-03-31 ENCOUNTER — Other Ambulatory Visit: Payer: Self-pay | Admitting: Family Medicine

## 2018-03-31 NOTE — Telephone Encounter (Signed)
Talked with patient about 12/31/17 OV notes, Lisinopril changed to Enalapril, years worth refills sent to [facility]

## 2018-04-04 ENCOUNTER — Other Ambulatory Visit: Payer: Self-pay | Admitting: Nurse Practitioner

## 2018-04-04 MED ORDER — ENALAPRIL MALEATE 2.5 MG PO TABS: 2.5000 mg | ORAL_TABLET | Freq: Every day | ORAL | 1 refills | Status: DC

## 2018-05-07 ENCOUNTER — Other Ambulatory Visit: Payer: Self-pay | Admitting: Pediatrics

## 2018-05-07 ENCOUNTER — Other Ambulatory Visit: Payer: Self-pay | Admitting: Family

## 2018-05-07 DIAGNOSIS — I10 Essential (primary) hypertension: Secondary | ICD-10-CM

## 2018-05-07 DIAGNOSIS — K219 Gastro-esophageal reflux disease without esophagitis: Secondary | ICD-10-CM

## 2018-05-07 DIAGNOSIS — J309 Allergic rhinitis, unspecified: Secondary | ICD-10-CM

## 2018-05-07 NOTE — Telephone Encounter (Signed)
OV 12/31/17 rtc 1 yr

## 2018-05-07 NOTE — Telephone Encounter (Signed)
OV 12/31/17 rtc 1 yr  

## 2018-05-11 ENCOUNTER — Other Ambulatory Visit: Payer: Self-pay | Admitting: Family Medicine

## 2018-05-11 ENCOUNTER — Telehealth: Payer: Self-pay | Admitting: Cardiology

## 2018-05-11 NOTE — Telephone Encounter (Signed)
Virtual Visit Pre-Appointment Phone Call  "(Name), I am calling you today to discuss your upcoming appointment. We are currently trying to limit exposure to the virus that causes COVID-19 by seeing patients at home rather than in the office."  1. "What is the BEST phone number to call the day of the visit?" - include this in appointment notes  2. Do you have or have access to (through a family member/friend) a smartphone with video capability that we can use for your visit?" a. If yes - list this number in appt notes as cell (if different from BEST phone #) and list the appointment type as a VIDEO visit in appointment notes b. If no - list the appointment type as a PHONE visit in appointment notes  3. Confirm consent - "In the setting of the current Covid19 crisis, you are scheduled for a (phone or video) visit with your provider on (date) at (time).  Just as we do with many in-office visits, in order for you to participate in this visit, we must obtain consent.  If you'd like, I can send this to your mychart (if signed up) or email for you to review.  Otherwise, I can obtain your verbal consent now.  All virtual visits are billed to your insurance company just like a normal visit would be.  By agreeing to a virtual visit, we'd like you to understand that the technology does not allow for your provider to perform an examination, and thus may limit your provider's ability to fully assess your condition. If your provider identifies any concerns that need to be evaluated in person, we will make arrangements to do so.  Finally, though the technology is pretty good, we cannot assure that it will always work on either your or our end, and in the setting of a video visit, we may have to convert it to a phone-only visit.  In either situation, we cannot ensure that we have a secure connection.  Are you willing to proceed?" STAFF: Did the patient verbally acknowledge consent to telehealth visit? Document  YES/NO here: yes  4. Advise patient to be prepared - "Two hours prior to your appointment, go ahead and check your blood pressure, pulse, oxygen saturation, and your weight (if you have the equipment to check those) and write them all down. When your visit starts, your provider will ask you for this information. If you have an Apple Watch or Kardia device, please plan to have heart rate information ready on the day of your appointment. Please have a pen and paper handy nearby the day of the visit as well."  5. Give patient instructions for MyChart download to smartphone OR [email or web address] as below if video visit (depending on what platform provider is using)  6. Inform patient they will receive a phone call 15 minutes prior to their appointment time (may be from unknown caller ID) so they should be prepared to answer    TELEPHONE CALL NOTE  James Snyder has been deemed a candidate for a follow-up tele-health visit to limit community exposure during the Covid-19 pandemic. I spoke with the patient via phone to ensure availability of phone/video source, confirm preferred email & phone number, and discuss instructions and expectations.  I reminded James Snyder to be prepared with any vital sign and/or heart rhythm information that could potentially be obtained via home monitoring, at the time of his visit. I reminded James Snyder to expect a phone call prior to  his visit.  Geraldine ContrasStephanie R Smith 05/11/2018 9:04 AM   INSTRUCTIONS FOR DOWNLOADING THE MYCHART APP TO SMARTPHONE  - The patient must first make sure to have activated MyChart and know their login information - If Apple, go to [employer] Store and type in MyChart in the search bar and download the app. If Android, ask patient to go to [employer] Play Store and type in MingusMyChart in the search bar and download the app. The app is free but as with any other app downloads, their phone may require them to verify saved payment information or Apple/Android  password.  - The patient will need to then log into the app with their MyChart username and password, and select [place] as their healthcare provider to link the account. When it is time for your visit, go to the MyChart app, find appointments, and click Begin Video Visit. Be sure to Select Allow for your device to access the Microphone and Camera for your visit. You will then be connected, and your provider will be with you shortly.  **If they have any issues connecting, or need assistance please contact MyChart service desk [phone])**  **If using a computer, in order to ensure the best quality for their visit they will need to use either of the following Internet Browsers: [employer] Edge, or Google Chrome**  IF USING DOXIMITY or DOXY.ME - The patient will receive a link just prior to their visit by text.     FULL LENGTH CONSENT FOR TELE-HEALTH VISIT   I hereby voluntarily request, consent and authorize [facility] and its employed or contracted physicians, physician assistants, nurse practitioners or other licensed health care professionals (the Practitioner), to provide me with telemedicine health care services (the Services") as deemed necessary by the treating Practitioner. I acknowledge and consent to receive the Services by the Practitioner via telemedicine. I understand that the telemedicine visit will involve communicating with the Practitioner through live audiovisual communication technology and the disclosure of certain medical information by electronic transmission. I acknowledge that I have been given the opportunity to request an in-person assessment or other available alternative prior to the telemedicine visit and am voluntarily participating in the telemedicine visit.  I understand that I have the right to withhold or withdraw my consent to the use of telemedicine in the course of my care at any time, without affecting my right to future care or treatment,  and that the Practitioner or I may terminate the telemedicine visit at any time. I understand that I have the right to inspect all information obtained and/or recorded in the course of the telemedicine visit and may receive copies of available information for a reasonable fee.  I understand that some of the potential risks of receiving the Services via telemedicine include:   Delay or interruption in medical evaluation due to technological equipment failure or disruption;  Information transmitted may not be sufficient (e.g. poor resolution of images) to allow for appropriate medical decision making by the Practitioner; and/or   In rare instances, security protocols could fail, causing a breach of personal health information.  Furthermore, I acknowledge that it is my responsibility to provide information about my medical history, conditions and care that is complete and accurate to the best of my ability. I acknowledge that Practitioner's advice, recommendations, and/or decision may be based on factors not within their control, such as incomplete or inaccurate data provided by me or distortions of diagnostic images or specimens that may result from electronic transmissions. I  understand that the practice of medicine is not an exact science and that Practitioner makes no warranties or guarantees regarding treatment outcomes. I acknowledge that I will receive a copy of this consent concurrently upon execution via email to the email address I last provided but may also request a printed copy by calling the office of [place].    I understand that my insurance will be billed for this visit.   I have read or had this consent read to me.  I understand the contents of this consent, which adequately explains the benefits and risks of the Services being provided via telemedicine.   I have been provided ample opportunity to ask questions regarding this consent and the Services and have had my questions  answered to my satisfaction.  I give my informed consent for the services to be provided through the use of telemedicine in my medical care  By participating in this telemedicine visit I agree to the above.

## 2018-05-11 NOTE — Telephone Encounter (Signed)
Last seen: 12/31/17

## 2018-05-14 ENCOUNTER — Encounter: Payer: Self-pay | Admitting: Cardiology

## 2018-05-14 ENCOUNTER — Telehealth (INDEPENDENT_AMBULATORY_CARE_PROVIDER_SITE_OTHER): Payer: PRIVATE HEALTH INSURANCE | Admitting: Cardiology

## 2018-05-14 VITALS — Ht 69.0 in | Wt 193.0 lb

## 2018-05-14 DIAGNOSIS — I471 Supraventricular tachycardia: Secondary | ICD-10-CM | POA: Diagnosis not present

## 2018-05-14 MED ORDER — METOPROLOL TARTRATE 25 MG PO TABS: ORAL_TABLET | ORAL | 1 refills | Status: DC

## 2018-05-14 NOTE — Progress Notes (Signed)
Virtual Visit via Video Note   This visit type was conducted due to national recommendations for restrictions regarding the COVID-19 Pandemic (e.g. social distancing) in an effort to limit this patient's exposure and mitigate transmission in our community.  Due to his co-morbid illnesses, this patient is at least at moderate risk for complications without adequate follow up.  This format is felt to be most appropriate for this patient at this time.  All issues noted in this document were discussed and addressed.  A limited physical exam was performed with this format.  Please refer to the patient's chart for his consent to telehealth for [facility].   Converted from video to phone chat during visit due to technical audio issues.   Evaluation Performed:  Follow-up visit  Date:  05/14/2018   ID:  James Snyder, DOB [birth year], MRN [id number]  Patient Location: Home Provider Location: Home  PCP:  Dettinger, Elige Radon, MD  Cardiologist:  Dr Dina Rich MD Electrophysiologist:  None   Chief Complaint:  Palpitations, hospital follow up  History of Present Illness:    James Snyder is a 43 y.o. male seen today for follow up of the following medical problems.    1. 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 K 3.3, Mg 2.1, TSH 1.51 - reports prior palpitations, but this was most severe episode. More intense, SOB. Occurred while at church. Lightheaded/dizzy. Reports had some energy drinks around that time.   - can still have some palpitations. Overall mild and infrequency. Can have some orthostatic dizziness at times.   05/10/18 seen in [facility] ER with SVT, converted with adenosine. K was 3.7, Mg not checked - lopressor was increased to 25mg  in AM and 12.5mg  pm  - denies any significant caffeine or EtOH use assoicated with the episode  - since ER visit no recurrent symptoms.     The patient does not have symptoms concerning  for COVID-19 infection (fever, chills, cough, or new shortness of breath).    Past Medical History:  Diagnosis Date  . Allergy   . GERD (gastroesophageal reflux disease)    Past Surgical History:  Procedure Laterality Date  . NO PAST SURGERIES       No outpatient medications have been marked as taking for the 05/14/18 encounter (Appointment) with Antoine Poche, MD.     Allergies:   Penicillins   Social History   Tobacco Use  . Smoking status: Former Smoker    Types: Cigars  . Smokeless tobacco: Former Neurosurgeon  . Tobacco comment: every once in a while  Substance Use Topics  . Alcohol use: No    Alcohol/week: 0.0 standard drinks  . Drug use: Not on file     Family Hx: The patient's family history includes Cancer in his mother; Heart disease in his father; Stroke in his brother.  ROS:   Please see the history of present illness.    All other systems reviewed and are negative.   Prior CV studies:   The following studies were reviewed today:   Labs/Other Tests and Data Reviewed:    EKG:  EKG from ER visit shows SVT rate 200  Recent Labs: 12/31/2017: ALT 26; BUN 8; Creatinine, Ser 0.86; Hemoglobin 14.5; Platelets 263; Potassium 4.0; Sodium 138   Recent Lipid Panel Lab Results  Component Value Date/Time   CHOL 181 12/31/2017 03:07 PM   TRIG 246 (H) 12/31/2017 03:07 PM  HDL 38 (L) 12/31/2017 03:07 PM   CHOLHDL 4.8 12/31/2017 03:07 PM   LDLCALC 94 12/31/2017 03:07 PM    Wt Readings from Last 3 Encounters:  12/31/17 198 lb (89.8 kg)  03/18/17 212 lb (96.2 kg)  10/08/16 210 lb (95.3 kg)     Objective:    Vital Signs:  There were no vitals taken for this visit.   Appropriate affect. Normal speech pattern and tone.   ASSESSMENT & PLAN:    1. PSVT - recent ER visit with SVT to 200s, converted with IV adenosine. First major episode since 04/2016 - lopressor was increased, no recurrent episodes since - discussed continued medical therapy vs ablation  procedure, will continue medical therapy at this time. If recurrent episodes refractory to medical therapy would consider referal to EP  COVID-19 Education: The signs and symptoms of COVID-19 were discussed with the patient and how to seek care for testing (follow up with PCP or arrange E-visit).  The importance of social distancing was discussed today.  Time:   Today, I have spent 18  minutes with the patient with telehealth technology discussing the above problems.     Medication Adjustments/Labs and Tests Ordered: Current medicines are reviewed at length with the patient today.  Concerns regarding medicines are outlined above.   Tests Ordered: No orders of the defined types were placed in this encounter.   Medication Changes: No orders of the defined types were placed in this encounter.   Disposition:  Follow up 4 months  Signed, Dina RichBranch, Kinley Ferrentino, MD  05/14/2018 7:55 AM    [facility]

## 2018-05-14 NOTE — Patient Instructions (Signed)
Your physician recommends that you schedule a follow-up appointment in: 4 MONTHS WITH DR [facility]  Your physician recommends that you continue on your current medications as directed. Please refer to the Current Medication list given to you today.  WE HAVE SENT REFILLS FOR LOPRESSOR 25 MG IN THE MORNING AND 12.5 MG IN THE EVENING   Thank you for choosing [facility]!!

## 2018-05-21 ENCOUNTER — Other Ambulatory Visit: Payer: Self-pay

## 2018-05-22 ENCOUNTER — Ambulatory Visit: Payer: PRIVATE HEALTH INSURANCE | Admitting: Family Medicine

## 2018-05-22 ENCOUNTER — Encounter: Payer: Self-pay | Admitting: Family Medicine

## 2018-05-22 VITALS — BP 141/92 | HR 78 | Temp 98.1°F | Ht 69.0 in | Wt 196.8 lb

## 2018-05-22 DIAGNOSIS — K219 Gastro-esophageal reflux disease without esophagitis: Secondary | ICD-10-CM | POA: Diagnosis not present

## 2018-05-22 DIAGNOSIS — A599 Trichomoniasis, unspecified: Secondary | ICD-10-CM

## 2018-05-22 DIAGNOSIS — R7303 Prediabetes: Secondary | ICD-10-CM | POA: Diagnosis not present

## 2018-05-22 DIAGNOSIS — K580 Irritable bowel syndrome with diarrhea: Secondary | ICD-10-CM | POA: Diagnosis not present

## 2018-05-22 DIAGNOSIS — I10 Essential (primary) hypertension: Secondary | ICD-10-CM

## 2018-05-22 LAB — BAYER DCA HB A1C WAIVED: HB A1C (BAYER DCA - WAIVED): 5.4 % (ref <7.0)

## 2018-05-22 MED ORDER — BACLOFEN 10 MG PO TABS: 10.0000 mg | ORAL_TABLET | Freq: Three times a day (TID) | ORAL | 0 refills | Status: DC

## 2018-05-22 MED ORDER — METOPROLOL TARTRATE 25 MG PO TABS: ORAL_TABLET | ORAL | 1 refills | Status: DC

## 2018-05-22 MED ORDER — OMEPRAZOLE 40 MG PO CPDR: 40.0000 mg | DELAYED_RELEASE_CAPSULE | Freq: Every day | ORAL | 3 refills | Status: DC

## 2018-05-22 MED ORDER — PRAVASTATIN SODIUM 10 MG PO TABS: 10.0000 mg | ORAL_TABLET | Freq: Every day | ORAL | 3 refills | Status: DC

## 2018-05-22 MED ORDER — RIFAXIMIN 200 MG PO TABS: 200.0000 mg | ORAL_TABLET | Freq: Three times a day (TID) | ORAL | 0 refills | Status: DC

## 2018-05-22 MED ORDER — METRONIDAZOLE 500 MG PO TABS: 500.0000 mg | ORAL_TABLET | Freq: Two times a day (BID) | ORAL | 0 refills | Status: DC

## 2018-05-22 NOTE — Progress Notes (Signed)
BP (!) 137/92   Pulse 78   Temp 98.1 F (36.7 C) (Oral)   Ht _0  (1.753 m)   Wt 196 lb 12.8 oz (89.3 kg)   BMI 29.06 kg/m    Subjective:   Patient ID: James Snyder, male    DOB: 06/25/1975, 43 y.o.   MRN: [id number]  HPI: James Snyder is a 43 y.o. male presenting on 05/22/2018 for Hypertension (check up of chronic medical conditions )   HPI Prediabetes Patient comes in today for recheck of his diabetes. Patient has been currently taking no medications currently. Patient is currently on an ACE inhibitor/ARB. Patient has not seen an ophthalmologist this year. Patient denies any issues with their feet.   GERD Patient is currently on omeprazole.  She denies any major symptoms or abdominal pain or belching or burping. She denies any blood in her stool or lightheadedness or dizziness.   Hypertension Patient is currently on metoprolol and enalapril, and their blood pressure today is 137/92. Patient denies any lightheadedness or dizziness. Patient denies headaches, blurred vision, chest pains, shortness of breath, or weakness. Denies any side effects from medication and is content with current medication.   Patient is having symptoms similar to when he got trichomonas from his wife previously.  He says a week and a half ago he did have a slip up and got back with his wife and he feels like he has had these left lower abdominal pains and irritation that is been going on since that time.  Patient was treated with 1 course of rifaximin for IBS he feels like it did him greatly good would like to be treated with it again.  Patient has been having some diarrhea and episodic episodes about 3-4 daily.  Relevant past medical, surgical, family and social history reviewed and updated as indicated. Interim medical history since our last visit reviewed. Allergies and medications reviewed and updated.  Review of Systems  Constitutional: Negative for chills and fever.  Eyes: Negative for visual  disturbance.  Respiratory: Negative for shortness of breath and wheezing.   Cardiovascular: Negative for chest pain and leg swelling.  Gastrointestinal: Positive for abdominal pain and diarrhea. Negative for blood in stool, constipation, nausea and vomiting.  Genitourinary: Negative for discharge, dysuria, flank pain, hematuria, penile pain, scrotal swelling and testicular pain.  Musculoskeletal: Negative for back pain and gait problem.  Skin: Negative for rash.  All other systems reviewed and are negative.   Per HPI unless specifically indicated above   Allergies as of 05/22/2018      Reactions   Penicillins Hives      Medication List       Accurate as of May 22, 2018  3:39 PM. If you have any questions, ask your nurse or doctor.        azelastine 0.05 % ophthalmic solution Commonly known as:  OPTIVAR PLACE 1 DROP INTO BOTH EYES DAILY AS NEEDED.   cetirizine 10 MG tablet Commonly known as:  ZYRTEC TAKE 1 TABLET BY MOUTH EVERY DAY   enalapril 2.5 MG tablet Commonly known as:  Vasotec Take 1 tablet (2.5 mg total) by mouth daily.   fluticasone 50 MCG/ACT nasal spray Commonly known as:  FLONASE PLACE 2 SPRAYS INTO BOTH NOSTRILS DAILY.   hydrocortisone 2.5 % ointment Apply topically 2 (two) times daily.   metoprolol tartrate 25 MG tablet Commonly known as:  LOPRESSOR TAKE 25 MG IN THE MORNING AND 12.5 MG IN THE EVENING  omeprazole 40 MG capsule Commonly known as:  PRILOSEC TAKE 1 CAPSULE BY MOUTH EVERY DAY   pravastatin 10 MG tablet Commonly known as:  PRAVACHOL Take 10 mg by mouth daily.   rifaximin 200 MG tablet Commonly known as:  Xifaxan Take 1 tablet (200 mg total) by mouth 3 (three) times daily.   sildenafil 100 MG tablet Commonly known as:  VIAGRA TAKE 1 TABLET BY MOUTH EVERY DAY AS NEEDED   valACYclovir 1000 MG tablet Commonly known as:  VALTREX TAKE 1 TABLET BY MOUTH EVERY DAY        Objective:   BP (!) 137/92   Pulse 78   Temp 98.1 F  (36.7 C) (Oral)   Ht _0  (1.753 m)   Wt 196 lb 12.8 oz (89.3 kg)   BMI 29.06 kg/m   Wt Readings from Last 3 Encounters:  05/22/18 196 lb 12.8 oz (89.3 kg)  05/14/18 193 lb (87.5 kg)  12/31/17 198 lb (89.8 kg)    Physical Exam Vitals signs and nursing note reviewed.  Constitutional:      General: He is not in acute distress.    Appearance: He is well-developed. He is not diaphoretic.  Eyes:     General: No scleral icterus.    Conjunctiva/sclera: Conjunctivae normal.  Neck:     Musculoskeletal: Neck supple.     Thyroid: No thyromegaly.  Cardiovascular:     Rate and Rhythm: Normal rate and regular rhythm.     Heart sounds: Normal heart sounds. No murmur.  Pulmonary:     Effort: Pulmonary effort is normal. No respiratory distress.     Breath sounds: Normal breath sounds. No wheezing.  Abdominal:     General: Abdomen is flat. Bowel sounds are normal. There is no distension.     Palpations: Abdomen is soft.     Tenderness: There is abdominal tenderness (Mild left lower abdominal tenderness). There is no right CVA tenderness, left CVA tenderness, guarding or rebound.     Hernia: No hernia is present.  Genitourinary:    Comments: Patient declined exam today Musculoskeletal: Normal range of motion.  Lymphadenopathy:     Cervical: No cervical adenopathy.  Skin:    General: Skin is warm and dry.     Findings: No rash.  Neurological:     Mental Status: He is alert and oriented to person, place, and time.     Coordination: Coordination normal.  Psychiatric:        Behavior: Behavior normal.       Assessment & Plan:   Problem List Items Addressed This Visit      Cardiovascular and Mediastinum   Essential hypertension   Relevant Medications   pravastatin (PRAVACHOL) 10 MG tablet   metoprolol tartrate (LOPRESSOR) 25 MG tablet   Other Relevant Orders   CBC with Differential/Platelet (Completed)   Lipid panel (Completed)     Digestive   Acid reflux   Relevant  Medications   omeprazole (PRILOSEC) 40 MG capsule   Other Relevant Orders   CMP14+EGFR (Completed)     Other   Pre-diabetes - Primary   Relevant Medications   pravastatin (PRAVACHOL) 10 MG tablet   Other Relevant Orders   CBC with Differential/Platelet (Completed)   Bayer DCA Hb A1c Waived (Completed)   Lipid panel (Completed)    Other Visit Diagnoses    Irritable bowel syndrome with diarrhea       Relevant Medications   omeprazole (PRILOSEC) 40 MG capsule   rifaximin (XIFAXAN)  200 MG tablet   Trichimoniasis       Relevant Medications   rifaximin (XIFAXAN) 200 MG tablet   metroNIDAZOLE (FLAGYL) 500 MG tablet      Will do a one-time refill of rifaximin and also give metronidazole to treat possible trichomonas, if returns that we may need to do full testing, patient declined testing today because he says his symptoms are exactly the same as last time.  Continue pravastatin and omeprazole and metoprolol and enalapril Follow up plan: Return in about 6 months (around 11/22/2018), or if symptoms worsen or fail to improve, for Prediabetes and GERD and hypertension.  Counseling provided for all of the vaccine components No orders of the defined types were placed in this encounter.   Caryl Pina, MD [facility] 05/22/2018, 3:39 PM

## 2018-05-23 LAB — CBC WITH DIFFERENTIAL/PLATELET
Basophils Absolute: 0 10*3/uL (ref 0.0–0.2)
Basos: 1 %
EOS (ABSOLUTE): 0 10*3/uL (ref 0.0–0.4)
Eos: 1 %
Hematocrit: 43.7 % (ref 37.5–51.0)
Hemoglobin: 15.2 g/dL (ref 13.0–17.7)
Immature Grans (Abs): 0 10*3/uL (ref 0.0–0.1)
Immature Granulocytes: 0 %
Lymphocytes Absolute: 2.5 10*3/uL (ref 0.7–3.1)
Lymphs: 39 %
MCH: 30.3 pg (ref 26.6–33.0)
MCHC: 34.8 g/dL (ref 31.5–35.7)
MCV: 87 fL (ref 79–97)
Monocytes Absolute: 0.5 10*3/uL (ref 0.1–0.9)
Monocytes: 8 %
Neutrophils Absolute: 3.2 10*3/uL (ref 1.4–7.0)
Neutrophils: 51 %
Platelets: 286 10*3/uL (ref 150–450)
RBC: 5.01 x10E6/uL (ref 4.14–5.80)
RDW: 14.2 % (ref 11.6–15.4)
WBC: 6.3 10*3/uL (ref 3.4–10.8)

## 2018-05-23 LAB — CMP14+EGFR
ALT: 34 IU/L (ref 0–44)
AST: 21 IU/L (ref 0–40)
Albumin/Globulin Ratio: 1.6 (ref 1.2–2.2)
Albumin: 4.4 g/dL (ref 4.0–5.0)
Alkaline Phosphatase: 50 IU/L (ref 39–117)
BUN/Creatinine Ratio: 7 — ABNORMAL LOW (ref 9–20)
BUN: 6 mg/dL (ref 6–24)
Bilirubin Total: 0.4 mg/dL (ref 0.0–1.2)
CO2: 20 mmol/L (ref 20–29)
Calcium: 9.5 mg/dL (ref 8.7–10.2)
Chloride: 103 mmol/L (ref 96–106)
Creatinine, Ser: 0.85 mg/dL (ref 0.76–1.27)
GFR calc Af Amer: 123 mL/min/{1.73_m2} (ref >59)
GFR calc non Af Amer: 106 mL/min/{1.73_m2} (ref >59)
Globulin, Total: 2.7 g/dL (ref 1.5–4.5)
Glucose: 89 mg/dL (ref 65–99)
Potassium: 3.9 mmol/L (ref 3.5–5.2)
Sodium: 141 mmol/L (ref 134–144)
Total Protein: 7.1 g/dL (ref 6.0–8.5)

## 2018-05-23 LAB — LIPID PANEL
Chol/HDL Ratio: 5.5 ratio — ABNORMAL HIGH (ref 0.0–5.0)
Cholesterol, Total: 215 mg/dL — ABNORMAL HIGH (ref 100–199)
HDL: 39 mg/dL — ABNORMAL LOW (ref >39)
LDL Calculated: 153 mg/dL — ABNORMAL HIGH (ref 0–99)
Triglycerides: 117 mg/dL (ref 0–149)
VLDL Cholesterol Cal: 23 mg/dL (ref 5–40)

## 2018-08-03 ENCOUNTER — Other Ambulatory Visit: Payer: Self-pay | Admitting: Family Medicine

## 2018-08-04 ENCOUNTER — Telehealth: Payer: Self-pay | Admitting: Family Medicine

## 2018-08-31 ENCOUNTER — Other Ambulatory Visit: Payer: Self-pay | Admitting: Nurse Practitioner

## 2018-09-02 ENCOUNTER — Other Ambulatory Visit: Payer: Self-pay | Admitting: *Deleted

## 2018-09-02 DIAGNOSIS — N529 Male erectile dysfunction, unspecified: Secondary | ICD-10-CM

## 2018-09-02 MED ORDER — SILDENAFIL CITRATE 100 MG PO TABS: 100.0000 mg | ORAL_TABLET | Freq: Every day | ORAL | 5 refills | Status: DC | PRN

## 2018-09-11 ENCOUNTER — Telehealth: Payer: Self-pay | Admitting: Cardiology

## 2018-09-11 NOTE — Telephone Encounter (Signed)

## 2018-09-14 ENCOUNTER — Encounter: Payer: Self-pay | Admitting: Cardiology

## 2018-09-14 ENCOUNTER — Other Ambulatory Visit: Payer: Self-pay

## 2018-09-14 ENCOUNTER — Ambulatory Visit (INDEPENDENT_AMBULATORY_CARE_PROVIDER_SITE_OTHER): Payer: PRIVATE HEALTH INSURANCE | Admitting: Cardiology

## 2018-09-14 VITALS — BP 130/85 | HR 90 | Ht 69.0 in | Wt 198.0 lb

## 2018-09-14 DIAGNOSIS — I471 Supraventricular tachycardia: Secondary | ICD-10-CM

## 2018-09-14 MED ORDER — PRAVASTATIN SODIUM 40 MG PO TABS: 40.0000 mg | ORAL_TABLET | Freq: Every evening | ORAL | 1 refills | Status: DC

## 2018-09-14 NOTE — Progress Notes (Signed)
Clinical Summary [name] is a 43 y.o.male  seen today for follow up of the following medical problems.    1. PSVT - prior  ER visit  04/2016 with papitations - EKG confirms SVT, converted with IV adenosine. K 3.3, Mg 2.1, TSH 1.51 - reports prior palpitations, but this was most severe episode. More intense, SOB. Occurred while at church. Lightheaded/dizzy. Reports had some energy drinks around that time.     05/10/18 [facility] with SVT, converted with adenosine. K was 3.7, Mg not checked - lopressor was increased to 25mg  in AM and 12.5mg  pm  - denies any significant caffeine or EtOH use assoicated with the episode   - no recent symptoms, taking lopressor 25mg  in AM and 12.5mg    SH: works as Astronomerpolice office at [employer]  Past Medical History:  Diagnosis Date  . Allergy   . GERD (gastroesophageal reflux disease)      Allergies  Allergen Reactions  . Penicillins Hives     Current Outpatient Medications  Medication Sig Dispense Refill  . azelastine (OPTIVAR) 0.05 % ophthalmic solution PLACE 1 DROP INTO BOTH EYES DAILY AS NEEDED. 6 mL 5  . baclofen (LIORESAL) 10 MG tablet Take 1 tablet (10 mg total) by mouth 3 (three) times daily. 30 each 0  . cetirizine (ZYRTEC) 10 MG tablet TAKE 1 TABLET BY MOUTH EVERY DAY 90 tablet 2  . enalapril (VASOTEC) 2.5 MG tablet TAKE 1 TABLET BY MOUTH EVERY DAY 90 tablet 0  . fluticasone (FLONASE) 50 MCG/ACT nasal spray PLACE 2 SPRAYS INTO BOTH NOSTRILS DAILY. 48 g 1  . hydrocortisone 2.5 % ointment Apply topically 2 (two) times daily. 30 g 0  . metoprolol tartrate (LOPRESSOR) 25 MG tablet TAKE 25 MG IN THE MORNING AND 12.5 MG IN THE EVENING 135 tablet 1  . metroNIDAZOLE (FLAGYL) 500 MG tablet Take 1 tablet (500 mg total) by mouth 2 (two) times daily. 14 tablet 0  . omeprazole (PRILOSEC) 40 MG capsule Take 1 capsule (40 mg total) by mouth daily. 90 capsule 3  . pravastatin (PRAVACHOL) 10 MG tablet Take 1 tablet  (10 mg total) by mouth daily. 90 tablet 3  . rifaximin (XIFAXAN) 200 MG tablet Take 1 tablet (200 mg total) by mouth 3 (three) times daily. 42 tablet 0  . sildenafil (VIAGRA) 100 MG tablet Take 1 tablet (100 mg total) by mouth daily as needed. 4 tablet 5  . valACYclovir (VALTREX) 1000 MG tablet TAKE 1 TABLET BY MOUTH EVERY DAY 90 tablet 0   No current facility-administered medications for this visit.      Past Surgical History:  Procedure Laterality Date  . NO PAST SURGERIES       Allergies  Allergen Reactions  . Penicillins Hives      Family History  Problem Relation Age of Onset  . Cancer Mother   . Heart disease Father   . Stroke Brother      Social History [name] that he has quit smoking. His smoking use included cigars. He has quit using smokeless tobacco. [name] no history of alcohol use.   Review of Systems CONSTITUTIONAL: No weight loss, fever, chills, weakness or fatigue.  HEENT: Eyes: No visual loss, blurred vision, double vision or yellow sclerae.No hearing loss, sneezing, congestion, runny nose or sore throat.  SKIN: No rash or itching.  CARDIOVASCULAR: per hpi RESPIRATORY: No shortness of breath, cough or sputum.  GASTROINTESTINAL: No anorexia,  nausea, vomiting or diarrhea. No abdominal pain or blood.  GENITOURINARY: No burning on urination, no polyuria NEUROLOGICAL: No headache, dizziness, syncope, paralysis, ataxia, numbness or tingling in the extremities. No change in bowel or bladder control.  MUSCULOSKELETAL: No muscle, back pain, joint pain or stiffness.  LYMPHATICS: No enlarged nodes. No history of splenectomy.  PSYCHIATRIC: No history of depression or anxiety.  ENDOCRINOLOGIC: No reports of sweating, cold or heat intolerance. No polyuria or polydipsia.  [name]   Physical Examination Today's Vitals   09/14/18 1412  BP: 130/85  Pulse: 90  SpO2: 98%  Weight: 198 lb (89.8 kg)  Height: 5\' 9"  (1.753 m)   Body mass index is  29.24 kg/m.  Gen: resting comfortably, no acute distress HEENT: no scleral icterus, pupils equal round and reactive, no palptable cervical adenopathy,  CV: RRR, no m/r/g, no jvd Resp: Clear to auscultation bilaterally GI: abdomen is soft, non-tender, non-distended, normal bowel sounds, no hepatosplenomegaly MSK: extremities are warm, no edema.  Skin: warm, no rash Neuro:  no focal deficits Psych: appropriate affect     Assessment and Plan   1. PSVT - symptoms are not frequent but when do occur are severe, seen in ER both in 04/2016 and 04/2018 (EKGs are in [place]) with SVT in the 200s requiring adenosine both times - continue lopressor. He is interested in considering ablation, we will refer to Dr Rayann Heman to further discuss. Though symptoms are not frequent they are severe and persistent when they do occur and he also is not in favor of staying on lopressor if at all possible to come off     Arnoldo Lenis, M.D.

## 2018-09-14 NOTE — Patient Instructions (Signed)
Your physician wants you to follow-up in: [place] will receive a reminder letter in the mail two months in advance. If you don't receive a letter, please call our office to schedule the follow-up appointment.  Your physician has recommended you make the following change in your medication:   INCREASE PRAVASTATIN 40 MG DAILY   You have been referred to DR [facility]  Thank you for choosing [facility]!!

## 2018-09-17 ENCOUNTER — Telehealth: Payer: Self-pay

## 2018-09-17 NOTE — Telephone Encounter (Signed)
Spoke with pt regarding appt on 09/23/18. Pt stated he will set up his MyChart account. Pt was advise to check his vitals prior to his appt and give me a call if he has any questions.

## 2018-09-23 ENCOUNTER — Telehealth (INDEPENDENT_AMBULATORY_CARE_PROVIDER_SITE_OTHER): Payer: PRIVATE HEALTH INSURANCE | Admitting: Internal Medicine

## 2018-09-23 ENCOUNTER — Encounter: Payer: Self-pay | Admitting: Internal Medicine

## 2018-09-23 ENCOUNTER — Telehealth: Payer: Self-pay

## 2018-09-23 VITALS — Ht 69.0 in | Wt 193.0 lb

## 2018-09-23 DIAGNOSIS — I10 Essential (primary) hypertension: Secondary | ICD-10-CM | POA: Diagnosis not present

## 2018-09-23 DIAGNOSIS — I471 Supraventricular tachycardia: Secondary | ICD-10-CM | POA: Diagnosis not present

## 2018-09-23 NOTE — Progress Notes (Signed)
Electrophysiology TeleHealth Note   Due to national recommendations of social distancing due to [place] 19, Audio telehealth visit is felt to be most appropriate for this patient at this time.  See MyChart message from today for patient consent regarding telehealth for [facility].   Date:  09/23/2018   ID:  James Snyder, DOB May 19, 1975, MRN [id number]  Location: home  Provider location:  [facility] Evaluation Performed: New patient consult  PCP:  Dettinger, Fransisca Kaufmann, MD  Cardiologist:  Carlyle Dolly, MD Electrophysiologist:  None   Chief Complaint:  SVT  History of Present Illness:    James Snyder is a 43 y.o. male who presents via audio conferencing for a telehealth visit today.   The patient is referred for new consultation regarding SVT by Dr Harl Bowie.  he reports initially having abrupt onset/offset of tachypalpitations while singing in the choir.  He received adenosine injection and his tachycardia terminated.  He had a second episode 04/2018.  He reports that he was making dinner when the episode occurred.  He again required adenosine for termination.  He is unaware of triggers.  He reports associated SOB and mild weakness.  Denies presyncope or syncope.  He has been treated with metoprolol for several years.  This was increased with the episode in April.  Today, he denies symptoms of palpitations, chest pain, shortness of breath, orthopnea, PND, lower extremity edema, claudication, dizziness, presyncope, syncope, bleeding, or neurologic sequela. The patient is tolerating medications without difficulties and is otherwise without complaint today.   he denies symptoms of cough, fevers, chills, or new SOB worrisome for COVID 19.   Past Medical History:  Diagnosis Date  . Allergy   . GERD (gastroesophageal reflux disease)     Past Surgical History:  Procedure Laterality Date  . NO PAST SURGERIES      Current Outpatient Medications  Medication Sig Dispense Refill   . azelastine (OPTIVAR) 0.05 % ophthalmic solution PLACE 1 DROP INTO BOTH EYES DAILY AS NEEDED. 6 mL 5  . baclofen (LIORESAL) 10 MG tablet Take 1 tablet (10 mg total) by mouth 3 (three) times daily. 30 each 0  . cetirizine (ZYRTEC) 10 MG tablet TAKE 1 TABLET BY MOUTH EVERY DAY 90 tablet 2  . enalapril (VASOTEC) 2.5 MG tablet TAKE 1 TABLET BY MOUTH EVERY DAY 90 tablet 0  . fluticasone (FLONASE) 50 MCG/ACT nasal spray PLACE 2 SPRAYS INTO BOTH NOSTRILS DAILY. (Patient taking differently: Place 2 sprays into both nostrils daily as needed. ) 48 g 1  . hydrocortisone 2.5 % ointment Apply topically 2 (two) times daily. 30 g 0  . metoprolol tartrate (LOPRESSOR) 25 MG tablet TAKE 25 MG IN THE MORNING AND 12.5 MG IN THE EVENING 135 tablet 1  . metroNIDAZOLE (FLAGYL) 500 MG tablet Take 1 tablet (500 mg total) by mouth 2 (two) times daily. 14 tablet 0  . omeprazole (PRILOSEC) 40 MG capsule Take 1 capsule (40 mg total) by mouth daily. 90 capsule 3  . pravastatin (PRAVACHOL) 40 MG tablet Take 1 tablet (40 mg total) by mouth every evening. 90 tablet 1  . rifaximin (XIFAXAN) 200 MG tablet Take 1 tablet (200 mg total) by mouth 3 (three) times daily. 42 tablet 0  . sildenafil (VIAGRA) 100 MG tablet Take 1 tablet (100 mg total) by mouth daily as needed. 4 tablet 5  . valACYclovir (VALTREX) 1000 MG tablet TAKE 1 TABLET BY MOUTH EVERY DAY 90 tablet 0   No current facility-administered  medications for this visit.     Allergies:   Penicillins   Social History:  The patient  reports that he has quit smoking. His smoking use included cigars. He has quit using smokeless tobacco. He reports that he does not drink alcohol.   Family History:  The patient's  family history includes Cancer in his mother; Heart disease in his father; Stroke in his brother.    ROS:  Please see the history of present illness.   All other systems are personally reviewed and negative.    Exam:    Vital Signs:  Ht 5\' 9"  (1.753 m)   Wt 193  lb (87.5 kg)   BMI 28.50 kg/m    Well sounding and conversant    Labs/Other Tests and Data Reviewed:    Recent Labs: 05/22/2018: ALT 34; BUN 6; Creatinine, Ser 0.85; Hemoglobin 15.2; Platelets 286; Potassium 3.9; Sodium 141   Wt Readings from Last 3 Encounters:  09/23/18 193 lb (87.5 kg)  09/14/18 198 lb (89.8 kg)  05/22/18 196 lb 12.8 oz (89.3 kg)     Other studies personally reviewed: Additional studies/ records that were reviewed today include: Dr Princess PernaBranchs notes,  ekg 05/10/2018 with SVT documented    ASSESSMENT & PLAN:    1.  SVT Therapeutic strategies for supraventricular tachycardia including medicine and ablation were discussed in detail with the patient today. Risk, benefits, and alternatives to EP study and radiofrequency ablation were also discussed in detail today. These risks include but are not limited to stroke, bleeding, vascular damage, tamponade, perforation, damage to the heart and other structures, AV block requiring pacemaker, worsening renal function, and death. The patient understands these risk and wishes to proceed.  We will therefore proceed with catheter ablation at the next available time.  Carto, ICE, and anesthesia are required Hold metoprolol 48 hours prior to the procedure.  2. HTN Stable No change required today    Current medicines are reviewed at length with the patient today.   The patient does not have concerns regarding his medicines.  The following changes were made today:  none  Labs/ tests ordered today include:  No orders of the defined types were placed in this encounter.   Patient Risk:  after full review of this patients clinical status, I feel that they are at highrisk at this time.   Today, I have spent 25 minutes with the patient with telehealth technology discussing SVT .    Signed, Hillis RangeJames Kiamesha Samet MD, [facility] 09/23/2018 9:15 AM   [facility] [address] [place] [id number] [phone]  (office) [phone] (fax)

## 2018-09-23 NOTE — Telephone Encounter (Signed)
-----   Message from Thompson Grayer, MD sent at 09/23/2018  9:35 AM EDT ----- SVT ablation   Carto/ICE/anesthesia   Hold metoprolol 48 hours prior

## 2018-10-08 NOTE — Telephone Encounter (Signed)
New Message     Patient wants to schedule ablation for November.  Please call back to discuss.

## 2018-10-09 NOTE — Telephone Encounter (Signed)
Follow Up  Patient is calling back to schedule ablation. Fine with scheduling ablation on November 4. Patient is wanting a call back to confirm all details.

## 2018-10-12 ENCOUNTER — Telehealth: Payer: Self-pay

## 2018-10-12 DIAGNOSIS — I471 Supraventricular tachycardia: Secondary | ICD-10-CM

## 2018-10-12 NOTE — Telephone Encounter (Signed)
Note   ----- Message from Thompson Grayer, MD sent at 09/23/2018  9:35 AM EDT ----- SVT ablation   Carto/ICE/anesthesia   Hold metoprolol 48 hours prior       Follow Up  Patient is calling back to schedule ablation. Fine with scheduling ablation on November 4. Patient is wanting a call back to confirm all details.

## 2018-10-12 NOTE — Telephone Encounter (Signed)
Call returned to Pt.  Pt scheduled for SVT ablation on November 18, 2018 at 8:30 am.   Work up complete and lab orders mailed with instruction letter.

## 2018-10-21 ENCOUNTER — Other Ambulatory Visit (HOSPITAL_COMMUNITY)
Admission: RE | Admit: 2018-10-21 | Discharge: 2018-10-21 | Disposition: A | Discharge status: Home / Self Care | Payer: PRIVATE HEALTH INSURANCE | Source: Ambulatory Visit | Attending: Internal Medicine | Admitting: Internal Medicine

## 2018-10-21 ENCOUNTER — Other Ambulatory Visit: Payer: Self-pay

## 2018-10-21 DIAGNOSIS — I471 Supraventricular tachycardia: Secondary | ICD-10-CM | POA: Diagnosis present

## 2018-10-21 LAB — CBC WITH DIFFERENTIAL/PLATELET
Abs Immature Granulocytes: 0.02 10*3/uL (ref 0.00–0.07)
Basophils Absolute: 0 10*3/uL (ref 0.0–0.1)
Basophils Relative: 1 %
Eosinophils Absolute: 0.1 10*3/uL (ref 0.0–0.5)
Eosinophils Relative: 1 %
HCT: 44.5 % (ref 39.0–52.0)
Hemoglobin: 15.5 g/dL (ref 13.0–17.0)
Immature Granulocytes: 0 %
Lymphocytes Relative: 35 %
Lymphs Abs: 2.1 10*3/uL (ref 0.7–4.0)
MCH: 29.7 pg (ref 26.0–34.0)
MCHC: 34.8 g/dL (ref 30.0–36.0)
MCV: 85.2 fL (ref 80.0–100.0)
Monocytes Absolute: 0.6 10*3/uL (ref 0.1–1.0)
Monocytes Relative: 10 %
Neutro Abs: 3.1 10*3/uL (ref 1.7–7.7)
Neutrophils Relative %: 53 %
Platelets: 250 10*3/uL (ref 150–400)
RBC: 5.22 MIL/uL (ref 4.22–5.81)
RDW: 12.4 % (ref 11.5–15.5)
WBC: 5.9 10*3/uL (ref 4.0–10.5)
nRBC: 0 % (ref 0.0–0.2)

## 2018-10-21 LAB — BASIC METABOLIC PANEL
Anion gap: 10 (ref 5–15)
BUN: 9 mg/dL (ref 6–20)
CO2: 23 mmol/L (ref 22–32)
Calcium: 9.2 mg/dL (ref 8.9–10.3)
Chloride: 105 mmol/L (ref 98–111)
Creatinine, Ser: 0.82 mg/dL (ref 0.61–1.24)
GFR calc Af Amer: >60 mL/min (ref >60)
GFR calc non Af Amer: >60 mL/min (ref >60)
Glucose, Bld: 114 mg/dL — ABNORMAL HIGH (ref 70–99)
Potassium: 3.9 mmol/L (ref 3.5–5.1)
Sodium: 138 mmol/L (ref 135–145)

## 2018-11-16 ENCOUNTER — Other Ambulatory Visit: Payer: Self-pay

## 2018-11-16 ENCOUNTER — Other Ambulatory Visit (HOSPITAL_COMMUNITY)
Admission: RE | Admit: 2018-11-16 | Discharge: 2018-11-16 | Disposition: A | Discharge status: Home / Self Care | Payer: PRIVATE HEALTH INSURANCE | Source: Ambulatory Visit | Attending: Internal Medicine | Admitting: Internal Medicine

## 2018-11-16 DIAGNOSIS — Z20828 Contact with and (suspected) exposure to other viral communicable diseases: Secondary | ICD-10-CM | POA: Diagnosis not present

## 2018-11-16 DIAGNOSIS — Z01812 Encounter for preprocedural laboratory examination: Secondary | ICD-10-CM | POA: Diagnosis not present

## 2018-11-16 LAB — SARS CORONAVIRUS 2 (TAT 6-24 HRS): SARS Coronavirus 2: NEGATIVE

## 2018-11-17 NOTE — Anesthesia Preprocedure Evaluation (Addendum)
Anesthesia Evaluation  Patient identified by MRN, date of birth, ID band Patient awake    Reviewed: Allergy & Precautions, NPO status , Patient's Chart, lab work & pertinent test results, reviewed documented beta blocker date and time   History of Anesthesia Complications Negative for: history of anesthetic complications  Airway Mallampati: II  TM Distance: >3 FB Neck ROM: Full    Dental no notable dental hx.    Pulmonary former smoker,    Pulmonary exam normal        Cardiovascular hypertension, Pt. on medications and Pt. on home beta blockers Normal cardiovascular exam+ dysrhythmias Supra Ventricular Tachycardia      Neuro/Psych negative neurological ROS  negative psych ROS   GI/Hepatic Neg liver ROS, GERD  Medicated and Controlled,  Endo/Other  negative endocrine ROS  Renal/GU negative Renal ROS  negative genitourinary   Musculoskeletal negative musculoskeletal ROS (+)   Abdominal   Peds  Hematology negative hematology ROS (+)   Anesthesia Other Findings Day of surgery medications reviewed with patient.  Reproductive/Obstetrics negative OB ROS                            Anesthesia Physical Anesthesia Plan  ASA: II  Anesthesia Plan: MAC   Post-op Pain Management:    Induction: Intravenous  PONV Risk Score and Plan: 1 and Treatment may vary due to age or medical condition, Ondansetron and Propofol infusion  Airway Management Planned: Natural Airway and Simple Face Mask  Additional Equipment: None  Intra-op Plan:   Post-operative Plan:   Informed Consent: I have reviewed the patients History and Physical, chart, labs and discussed the procedure including the risks, benefits and alternatives for the proposed anesthesia with the patient or authorized representative who has indicated his/her understanding and acceptance.       Plan Discussed with: CRNA  Anesthesia Plan  Comments:       Anesthesia Quick Evaluation

## 2018-11-18 ENCOUNTER — Other Ambulatory Visit: Payer: Self-pay

## 2018-11-18 ENCOUNTER — Encounter (HOSPITAL_COMMUNITY): Payer: Self-pay | Admitting: Internal Medicine

## 2018-11-18 ENCOUNTER — Ambulatory Visit (HOSPITAL_COMMUNITY): Payer: PRIVATE HEALTH INSURANCE | Admitting: Anesthesiology

## 2018-11-18 ENCOUNTER — Encounter (HOSPITAL_COMMUNITY)
Admission: RE | Disposition: A | Discharge status: Home / Self Care | Payer: Self-pay | Source: Home / Self Care | Attending: Internal Medicine

## 2018-11-18 ENCOUNTER — Ambulatory Visit (HOSPITAL_COMMUNITY)
Admission: RE | Admit: 2018-11-18 | Discharge: 2018-11-18 | Disposition: A | Discharge status: Home / Self Care | Payer: PRIVATE HEALTH INSURANCE | Attending: Internal Medicine | Admitting: Internal Medicine

## 2018-11-18 DIAGNOSIS — I471 Supraventricular tachycardia: Secondary | ICD-10-CM | POA: Insufficient documentation

## 2018-11-18 DIAGNOSIS — Z87891 Personal history of nicotine dependence: Secondary | ICD-10-CM | POA: Insufficient documentation

## 2018-11-18 DIAGNOSIS — Z880 Allergy status to penicillin: Secondary | ICD-10-CM | POA: Diagnosis not present

## 2018-11-18 DIAGNOSIS — Z79899 Other long term (current) drug therapy: Secondary | ICD-10-CM | POA: Insufficient documentation

## 2018-11-18 DIAGNOSIS — I10 Essential (primary) hypertension: Secondary | ICD-10-CM | POA: Insufficient documentation

## 2018-11-18 DIAGNOSIS — K219 Gastro-esophageal reflux disease without esophagitis: Secondary | ICD-10-CM | POA: Insufficient documentation

## 2018-11-18 HISTORY — DX: Supraventricular tachycardia: I47.1

## 2018-11-18 HISTORY — DX: Supraventricular tachycardia, unspecified: I47.10

## 2018-11-18 HISTORY — PX: SVT ABLATION: EP1225

## 2018-11-18 SURGERY — SVT ABLATION
Anesthesia: General

## 2018-11-18 MED ORDER — ACETAMINOPHEN 325 MG PO TABS: 650.0000 mg | ORAL_TABLET | ORAL | Status: DC | PRN

## 2018-11-18 MED ORDER — ROCURONIUM BROMIDE 10 MG/ML (PF) SYRINGE
PREFILLED_SYRINGE | INTRAVENOUS | Status: DC | PRN
  Administered 2018-11-18: 50 mg via INTRAVENOUS

## 2018-11-18 MED ORDER — HEPARIN (PORCINE) IN NACL 1000-0.9 UT/500ML-% IV SOLN: INTRAVENOUS | Status: DC | PRN

## 2018-11-18 MED ORDER — PROPOFOL 500 MG/50ML IV EMUL
INTRAVENOUS | Status: DC | PRN
  Administered 2018-11-18: 75 ug/kg/min via INTRAVENOUS

## 2018-11-18 MED ORDER — PROMETHAZINE HCL 25 MG/ML IJ SOLN: 6.2500 mg | INTRAMUSCULAR | Status: DC | PRN

## 2018-11-18 MED ORDER — HEPARIN (PORCINE) IN NACL 1000-0.9 UT/500ML-% IV SOLN
INTRAVENOUS | Status: AC
  Filled 2018-11-18: qty 500

## 2018-11-18 MED ORDER — MIDAZOLAM HCL 2 MG/2ML IJ SOLN
INTRAMUSCULAR | Status: DC | PRN
  Administered 2018-11-18 (×2): 2 mg via INTRAVENOUS

## 2018-11-18 MED ORDER — FENTANYL CITRATE (PF) 100 MCG/2ML IJ SOLN: 25.0000 ug | INTRAMUSCULAR | Status: DC | PRN

## 2018-11-18 MED ORDER — SODIUM CHLORIDE 0.9 % IV SOLN
INTRAVENOUS | Status: DC
  Administered 2018-11-18: 07:00:00 via INTRAVENOUS

## 2018-11-18 MED ORDER — ROCURONIUM BROMIDE 10 MG/ML (PF) SYRINGE: PREFILLED_SYRINGE | INTRAVENOUS | Status: DC | PRN

## 2018-11-18 MED ORDER — PROPOFOL 10 MG/ML IV BOLUS
INTRAVENOUS | Status: DC | PRN
  Administered 2018-11-18: 150 mg via INTRAVENOUS

## 2018-11-18 MED ORDER — SODIUM CHLORIDE 0.9% FLUSH: 3.0000 mL | Freq: Two times a day (BID) | INTRAVENOUS | Status: DC

## 2018-11-18 MED ORDER — BUPIVACAINE HCL (PF) 0.25 % IJ SOLN
INTRAMUSCULAR | Status: DC | PRN
  Administered 2018-11-18: 30 mL

## 2018-11-18 MED ORDER — HEPARIN SODIUM (PORCINE) 1000 UNIT/ML IJ SOLN
INTRAMUSCULAR | Status: DC | PRN
  Administered 2018-11-18: 1000 [IU] via INTRAVENOUS

## 2018-11-18 MED ORDER — SODIUM CHLORIDE 0.9 % IV SOLN: 250.0000 mL | INTRAVENOUS | Status: DC | PRN

## 2018-11-18 MED ORDER — SUGAMMADEX SODIUM 200 MG/2ML IV SOLN
INTRAVENOUS | Status: DC | PRN
  Administered 2018-11-18: 180 mg via INTRAVENOUS

## 2018-11-18 MED ORDER — DEXMEDETOMIDINE HCL 200 MCG/2ML IV SOLN
INTRAVENOUS | Status: DC | PRN
  Administered 2018-11-18: 20 ug via INTRAVENOUS
  Administered 2018-11-18: 12 ug via INTRAVENOUS

## 2018-11-18 MED ORDER — ONDANSETRON HCL 4 MG/2ML IJ SOLN
INTRAMUSCULAR | Status: DC | PRN
  Administered 2018-11-18: 4 mg via INTRAVENOUS

## 2018-11-18 MED ORDER — ISOPROTERENOL HCL 0.2 MG/ML IJ SOLN
INTRAMUSCULAR | Status: AC
  Filled 2018-11-18: qty 5

## 2018-11-18 MED ORDER — OXYCODONE HCL 5 MG/5ML PO SOLN: 5.0000 mg | Freq: Once | ORAL | Status: DC | PRN

## 2018-11-18 MED ORDER — ONDANSETRON HCL 4 MG/2ML IJ SOLN: 4.0000 mg | Freq: Four times a day (QID) | INTRAMUSCULAR | Status: DC | PRN

## 2018-11-18 MED ORDER — FENTANYL CITRATE (PF) 250 MCG/5ML IJ SOLN
INTRAMUSCULAR | Status: DC | PRN
  Administered 2018-11-18: 25 ug via INTRAVENOUS

## 2018-11-18 MED ORDER — SODIUM CHLORIDE 0.9% FLUSH: 3.0000 mL | INTRAVENOUS | Status: DC | PRN

## 2018-11-18 MED ORDER — LIDOCAINE 2% (20 MG/ML) 5 ML SYRINGE
INTRAMUSCULAR | Status: DC | PRN
  Administered 2018-11-18: 40 mg via INTRAVENOUS

## 2018-11-18 MED ORDER — ISOPROTERENOL HCL 0.2 MG/ML IJ SOLN
INTRAVENOUS | Status: DC | PRN
  Administered 2018-11-18: 4 ug/min via INTRAVENOUS

## 2018-11-18 MED ORDER — OXYCODONE HCL 5 MG PO TABS: 5.0000 mg | ORAL_TABLET | Freq: Once | ORAL | Status: DC | PRN

## 2018-11-18 MED ORDER — HYDROCODONE-ACETAMINOPHEN 5-325 MG PO TABS: 1.0000 | ORAL_TABLET | ORAL | Status: DC | PRN

## 2018-11-18 MED ORDER — ACETAMINOPHEN 500 MG PO TABS
1000.0000 mg | ORAL_TABLET | Freq: Once | ORAL | Status: AC
  Administered 2018-11-18: 07:00:00 1000 mg via ORAL
  Filled 2018-11-18 (×2): qty 2

## 2018-11-18 MED ORDER — SUCCINYLCHOLINE CHLORIDE 20 MG/ML IJ SOLN
INTRAMUSCULAR | Status: DC | PRN
  Administered 2018-11-18: 120 mg via INTRAVENOUS

## 2018-11-18 SURGICAL SUPPLY — 16 items
BLANKET WARM UNDERBOD FULL ACC (MISCELLANEOUS) ×2 IMPLANT
CATH EZ STEER NAV 4MM D-F CUR (ABLATOR) ×2 IMPLANT
CATH JOSEPH QUAD ALLRED 6F REP (CATHETERS) ×4 IMPLANT
CATH NAVISTAR SMARTTOUCH DF (ABLATOR) ×2 IMPLANT
CATH WEBSTER BI DIR CS D-F CRV (CATHETERS) ×2 IMPLANT
DEVICE CLOSURE PERCLS PRGLD 6F (VASCULAR PRODUCTS) IMPLANT
PACK EP LATEX FREE (CUSTOM PROCEDURE TRAY) ×2
PACK EP LF (CUSTOM PROCEDURE TRAY) ×1 IMPLANT
PAD PRO RADIOLUCENT 2001M-C (PAD) ×3 IMPLANT
PATCH CARTO3 (PAD) ×2 IMPLANT
PERCLOSE PROGLIDE 6F (VASCULAR PRODUCTS) ×9
SHEATH PINNACLE 7F 10CM (SHEATH) ×4 IMPLANT
SHEATH PINNACLE 8F 10CM (SHEATH) ×2 IMPLANT
SHEATH PROBE COVER 6X72 (BAG) ×2 IMPLANT
SHIELD RADPAD SCOOP 12X17 (MISCELLANEOUS) ×3 IMPLANT
TUBING SMART ABLATE COOLFLOW (TUBING) ×2 IMPLANT

## 2018-11-18 NOTE — H&P (Signed)
Chief Complaint:  SVT  History of Present Illness:    James Snyder is a 43 y.o. male who presents for EP study and ablation of SVT.   The patient is referred for new consultation regarding SVT by Dr Harl Bowie.  he reports initially having abrupt onset/offset of tachypalpitations while singing in the choir.  He received adenosine injection and his tachycardia terminated.  He had a second episode 04/2018.  He reports that he was making dinner when the episode occurred.  He again required adenosine for termination.  He is unaware of triggers.  He reports associated SOB and mild weakness.  Denies presyncope or syncope.  He has been treated with metoprolol for several years.  This was increased with the episode in April.  Today, he denies symptoms of palpitations, chest pain, shortness of breath, orthopnea, PND, lower extremity edema, claudication, dizziness, presyncope, syncope, bleeding, or neurologic sequela. The patient is tolerating medications without difficulties and is otherwise without complaint today.   he denies symptoms of cough, fevers, chills, or new SOB worrisome for COVID 19.       Past Medical History:  Diagnosis Date  . Allergy   . GERD (gastroesophageal reflux disease)   SVT       Past Surgical History:  Procedure Laterality Date  . NO PAST SURGERIES            Current Outpatient Medications  Medication Sig Dispense Refill  . azelastine (OPTIVAR) 0.05 % ophthalmic solution PLACE 1 DROP INTO BOTH EYES DAILY AS NEEDED. 6 mL 5  . baclofen (LIORESAL) 10 MG tablet Take 1 tablet (10 mg total) by mouth 3 (three) times daily. 30 each 0  . cetirizine (ZYRTEC) 10 MG tablet TAKE 1 TABLET BY MOUTH EVERY DAY 90 tablet 2  . enalapril (VASOTEC) 2.5 MG tablet TAKE 1 TABLET BY MOUTH EVERY DAY 90 tablet 0  . fluticasone (FLONASE) 50 MCG/ACT nasal spray PLACE 2 SPRAYS INTO BOTH NOSTRILS DAILY. (Patient taking differently: Place 2 sprays into both nostrils daily as needed. ) 48 g  1  . hydrocortisone 2.5 % ointment Apply topically 2 (two) times daily. 30 g 0  . metoprolol tartrate (LOPRESSOR) 25 MG tablet TAKE 25 MG IN THE MORNING AND 12.5 MG IN THE EVENING 135 tablet 1  . metroNIDAZOLE (FLAGYL) 500 MG tablet Take 1 tablet (500 mg total) by mouth 2 (two) times daily. 14 tablet 0  . omeprazole (PRILOSEC) 40 MG capsule Take 1 capsule (40 mg total) by mouth daily. 90 capsule 3  . pravastatin (PRAVACHOL) 40 MG tablet Take 1 tablet (40 mg total) by mouth every evening. 90 tablet 1  . rifaximin (XIFAXAN) 200 MG tablet Take 1 tablet (200 mg total) by mouth 3 (three) times daily. 42 tablet 0  . sildenafil (VIAGRA) 100 MG tablet Take 1 tablet (100 mg total) by mouth daily as needed. 4 tablet 5  . valACYclovir (VALTREX) 1000 MG tablet TAKE 1 TABLET BY MOUTH EVERY DAY 90 tablet 0   No current facility-administered medications for this visit.     Allergies:   Penicillins   Social History:  The patient  reports that he has quit smoking. His smoking use included cigars. He has quit using smokeless tobacco. He reports that he does not drink alcohol.   Family History:  The patient's  family history includes Cancer in his mother; Heart disease in his father; Stroke in his brother.    ROS:  Please see the history of present illness.  All other systems are personally reviewed and negative.   Physical Exam: Vitals:   11/18/18 0633  BP: (!) 169/114  Pulse: (!) 110  Resp: 18  Temp: 97.8 F (36.6 C)  TempSrc: Skin  SpO2: 99%  Weight: 88.5 kg  Height: 5\' 9"  (1.753 m)    GEN- The patient is well appearing, alert and oriented x 3 today.   Head- normocephalic, atraumatic Eyes-  Sclera clear, conjunctiva pink Ears- hearing intact Oropharynx- clear Neck- supple, Lungs- Clear to ausculation bilaterally, normal work of breathing Heart- Regular rate and rhythm, no murmurs, rubs or gallops, PMI not laterally displaced GI- soft, NT, ND, + BS Extremities- no clubbing,  cyanosis, or edema, groin is without hematoma/ bruit   Labs/Other Tests and Data Reviewed:    Recent Labs: 05/22/2018: ALT 34; BUN 6; Creatinine, Ser 0.85; Hemoglobin 15.2; Platelets 286; Potassium 3.9; Sodium 141      Wt Readings from Last 3 Encounters:  09/23/18 193 lb (87.5 kg)  09/14/18 198 lb (89.8 kg)  05/22/18 196 lb 12.8 oz (89.3 kg)     Other studies personally reviewed: Additional studies/ records that were reviewed today include: Dr 07/22/18 notes,  ekg 05/10/2018 with SVT documented    ASSESSMENT & PLAN:    1.  SVT Therapeutic strategies for supraventricular tachycardia including medicine and ablation were discussed in detail with the patient today. Risk, benefits, and alternatives to EP study and radiofrequency ablation were also discussed in detail today. These risks include but are not limited to stroke, bleeding, vascular damage, tamponade, perforation, damage to the heart and other structures, AV block requiring pacemaker, worsening renal function, and death. The patient understands these risk and wishes to proceed at this time.    2. HTN Stable No change required today  05/12/2018 MD, [facility] 11/18/2018 8:04 AM

## 2018-11-18 NOTE — Anesthesia Postprocedure Evaluation (Signed)
Anesthesia Post Note  Patient: James Snyder  Procedure(s) Performed: SVT ABLATION (N/A )     Patient location during evaluation: PACU Anesthesia Type: General Level of consciousness: awake and alert and oriented Pain management: pain level controlled Vital Signs Assessment: post-procedure vital signs reviewed and stable Respiratory status: spontaneous breathing, nonlabored ventilation and respiratory function stable Cardiovascular status: blood pressure returned to baseline Postop Assessment: no apparent nausea or vomiting Anesthetic complications: no    Last Vitals:  Vitals:   11/18/18 1230 11/18/18 1245  BP: 126/90 (!) 134/104  Pulse: 90 95  Resp: 16 17  Temp: 36.7 C   SpO2: 100% 98%    Last Pain:  Vitals:   11/18/18 1245  TempSrc:   PainSc: 0-No pain                 Brennan Bailey

## 2018-11-18 NOTE — Anesthesia Procedure Notes (Signed)
Procedure Name: MAC Performed by: Lorisa Scheid B, CRNA Pre-anesthesia Checklist: Patient identified, Emergency Drugs available, Suction available, Patient being monitored and Timeout performed Patient Re-evaluated:Patient Re-evaluated prior to induction Oxygen Delivery Method: Simple face mask Preoxygenation: Pre-oxygenation with 100% oxygen Induction Type: IV induction Placement Confirmation: positive ETCO2 Dental Injury: Teeth and Oropharynx as per pre-operative assessment        

## 2018-11-18 NOTE — Transfer of Care (Signed)
Immediate Anesthesia Transfer of Care Note  Patient: James Snyder  Procedure(s) Performed: SVT ABLATION (N/A )  Patient Location: Cath Lab  Anesthesia Type:General  Level of Consciousness: drowsy  Airway & Oxygen Therapy: Patient Spontanous Breathing and Patient connected to face mask oxygen  Post-op Assessment: Report given to RN and Post -op Vital signs reviewed and stable  Post vital signs: Reviewed and stable  Last Vitals:  Vitals Value Taken Time  BP 135/97 11/18/18 1117  Temp    Pulse 87 11/18/18 1118  Resp 23 11/18/18 1118  SpO2 100 % 11/18/18 1118  Vitals shown include unvalidated device data.  Last Pain:  Vitals:   11/18/18 1007  TempSrc: Skin  PainSc:       Patients Stated Pain Goal: 3 (01/01/74 8832)  Complications: No apparent anesthesia complications

## 2018-11-18 NOTE — Anesthesia Procedure Notes (Signed)
Procedure Name: Intubation Performed by: Valda Favia, CRNA Pre-anesthesia Checklist: Patient identified, Emergency Drugs available, Suction available and Patient being monitored Patient Re-evaluated:Patient Re-evaluated prior to induction Oxygen Delivery Method: Circle system utilized Preoxygenation: Pre-oxygenation with 100% oxygen Induction Type: IV induction and Rapid sequence Laryngoscope Size: Mac and 4 Grade View: Grade II Tube type: Oral Tube size: 7.5 mm Number of attempts: 1 Airway Equipment and Method: Stylet Placement Confirmation: ETT inserted through vocal cords under direct vision,  positive ETCO2 and breath sounds checked- equal and bilateral Secured at: 23 cm Tube secured with: Tape Dental Injury: Teeth and Oropharynx as per pre-operative assessment

## 2018-11-18 NOTE — Progress Notes (Signed)
No bleeding or hematoma noted after ambulation 

## 2018-11-18 NOTE — Discharge Instructions (Signed)

## 2018-11-19 ENCOUNTER — Encounter (HOSPITAL_COMMUNITY): Payer: Self-pay | Admitting: Internal Medicine

## 2018-11-19 MED FILL — Heparin Sod (Porcine)-NaCl IV Soln 1000 Unit/500ML-0.9%: INTRAVENOUS | Qty: 500 | Status: AC

## 2018-12-08 ENCOUNTER — Telehealth: Payer: Self-pay | Admitting: Internal Medicine

## 2018-12-08 NOTE — Telephone Encounter (Signed)
Patient is calling in regards to needing a letter stating he is okay to go back to work after his procedure. Patient would like to go back to work on Monday 12/14/18. Patient would like letter emailed to him at [email or web address].

## 2018-12-09 NOTE — Telephone Encounter (Signed)
Discussed with Dr. Rayann Heman.  Per Dr. Mosie Lukes letter advising return to work 7 days post procedure.  Letter created and faxed as requested

## 2018-12-16 ENCOUNTER — Encounter: Payer: Self-pay | Admitting: Internal Medicine

## 2018-12-16 ENCOUNTER — Telehealth (INDEPENDENT_AMBULATORY_CARE_PROVIDER_SITE_OTHER): Payer: PRIVATE HEALTH INSURANCE | Admitting: Internal Medicine

## 2018-12-16 VITALS — BP 138/91 | HR 89 | Ht 69.0 in | Wt 190.0 lb

## 2018-12-16 DIAGNOSIS — I471 Supraventricular tachycardia: Secondary | ICD-10-CM

## 2018-12-16 DIAGNOSIS — I10 Essential (primary) hypertension: Secondary | ICD-10-CM | POA: Diagnosis not present

## 2018-12-16 NOTE — Progress Notes (Signed)
Electrophysiology TeleHealth Note   Due to national recommendations of social distancing due to COVID 19, an audio/video telehealth visit is felt to be most appropriate for this patient at this time.  See MyChart message from today for the patient's consent to telehealth for [facility]    Date:  12/16/2018   ID:  James Snyder, DOB 02/18/1975, MRN [id number]  Location: patient's home  Provider location:  [facility]  Evaluation Performed: Follow-up visit  PCP:  Dettinger, Elige Radon, MD   Electrophysiologist:  Dr Johney Frame  Chief Complaint:  SVT follow up  History of Present Illness:    James Snyder is a 43 y.o. male who presents via telehealth conferencing today.  Since last being seen in our clinic, the patient reports doing very well. He has had no procedure related complications.  Today, he denies symptoms of palpitations, chest pain, shortness of breath,  lower extremity edema, dizziness, presyncope, or syncope.  The patient is otherwise without complaint today.  The patient denies symptoms of fevers, chills, cough, or new SOB worrisome for COVID 19.  Past Medical History:  Diagnosis Date  . Allergy   . Erectile dysfunction   . GERD (gastroesophageal reflux disease)   . Hyperlipidemia   . Hypertension   . SVT (supraventricular tachycardia) (HCC)     Past Surgical History:  Procedure Laterality Date  . NO PAST SURGERIES    . SVT ABLATION N/A 11/18/2018   Procedure: SVT ABLATION;  Surgeon: Hillis Range, MD;  Location: [facility] INVASIVE CV LAB;  Service: Cardiovascular;  Laterality: N/A;    Current Outpatient Medications  Medication Sig Dispense Refill  . baclofen (LIORESAL) 10 MG tablet Take 1 tablet (10 mg total) by mouth 3 (three) times daily. (Patient taking differently: Take 10 mg by mouth 3 (three) times daily as needed for muscle spasms. ) 30 each 0  . cetirizine (ZYRTEC) 10 MG tablet TAKE 1 TABLET BY MOUTH EVERY DAY (Patient taking differently: Take 10 mg by  mouth at bedtime. ) 90 tablet 2  . enalapril (VASOTEC) 2.5 MG tablet TAKE 1 TABLET BY MOUTH EVERY DAY (Patient taking differently: Take 2.5 mg by mouth daily. ) 90 tablet 0  . fluticasone (FLONASE) 50 MCG/ACT nasal spray PLACE 2 SPRAYS INTO BOTH NOSTRILS DAILY. (Patient taking differently: Place 2 sprays into both nostrils daily as needed for allergies. ) 48 g 1  . metroNIDAZOLE (FLAGYL) 500 MG tablet Take 1 tablet (500 mg total) by mouth 2 (two) times daily. 14 tablet 0  . omeprazole (PRILOSEC) 40 MG capsule Take 1 capsule (40 mg total) by mouth daily. 90 capsule 3  . rifaximin (XIFAXAN) 200 MG tablet Take 1 tablet (200 mg total) by mouth 3 (three) times daily. 42 tablet 0  . sildenafil (VIAGRA) 100 MG tablet Take 1 tablet (100 mg total) by mouth daily as needed. (Patient taking differently: Take 100 mg by mouth daily as needed for erectile dysfunction. ) 4 tablet 5  . valACYclovir (VALTREX) 1000 MG tablet TAKE 1 TABLET BY MOUTH EVERY DAY (Patient taking differently: Take 1,000 mg by mouth daily. ) 90 tablet 0  . pravastatin (PRAVACHOL) 40 MG tablet Take 1 tablet (40 mg total) by mouth every evening. 90 tablet 1   No current facility-administered medications for this visit.     Allergies:   Penicillins   Social History:  The patient  reports that he has quit smoking. His smoking use included cigars. He has quit using smokeless tobacco.  He reports that he does not drink alcohol.   Family History:  The patient's  family history includes Cancer in his mother; Heart disease in his father; Stroke in his brother.   ROS:  Please see the history of present illness.   All other systems are personally reviewed and negative.    Exam:    Vital Signs:  BP (!) 138/91   Pulse 89   Ht 5\' 9"  (1.753 m)   Wt 190 lb (86.2 kg)   BMI 28.06 kg/m   Well sounding, alert and conversant, regular work of breathing   Labs/Other Tests and Data Reviewed:    Recent Labs: 05/22/2018: ALT 34 10/21/2018: BUN 9;  Creatinine, Ser 0.82; Hemoglobin 15.5; Platelets 250; Potassium 3.9; Sodium 138   Wt Readings from Last 3 Encounters:  12/16/18 190 lb (86.2 kg)  11/18/18 195 lb (88.5 kg)  09/23/18 193 lb (87.5 kg)      ASSESSMENT & PLAN:    1.  AVNRT s/p ablation Doing well post ablation  No procedure related complications  2.  HTN Stable No change required today    Follow-up:  EP prn    Patient Risk:  after full review of this patients clinical status, I feel that they are at moderate risk at this time.  Today, I have spent 15 minutes with the patient with telehealth technology discussing arrhythmia management .    Army Fossa, MD  12/16/2018 10:22 AM     [facility] [address] [place] [address] [phone] (office) [phone] (fax)

## 2018-12-22 ENCOUNTER — Telehealth: Payer: Self-pay | Admitting: Family Medicine

## 2018-12-22 NOTE — Telephone Encounter (Signed)
Pt states he has to have a physical before the end of the year for his work. You don't have any openings, can he be seen with another provider so he doesn't get penalized at work?

## 2018-12-22 NOTE — Telephone Encounter (Signed)
I think it would be fine to have him see another provider, just ask them if they are okay with seeing him.

## 2018-12-22 NOTE — Telephone Encounter (Signed)
That is fine but make him aware he needs to schedule with PCP for this in the future. He needs to schedule earlier in the year if he knows this has to be completed by a certain date. Thanks.  Sharyn Lull

## 2018-12-23 NOTE — Telephone Encounter (Signed)
Scheduled pt with [name] for CPE 12/18 at 10:05.

## 2018-12-31 ENCOUNTER — Other Ambulatory Visit: Payer: Self-pay

## 2018-12-31 ENCOUNTER — Other Ambulatory Visit: Payer: Self-pay | Admitting: Family Medicine

## 2019-01-01 ENCOUNTER — Ambulatory Visit (INDEPENDENT_AMBULATORY_CARE_PROVIDER_SITE_OTHER): Payer: PRIVATE HEALTH INSURANCE | Admitting: Family Medicine

## 2019-01-01 ENCOUNTER — Encounter: Payer: Self-pay | Admitting: Family Medicine

## 2019-01-01 VITALS — BP 130/82 | HR 100 | Temp 98.6°F | Resp 20 | Ht 69.0 in | Wt 195.0 lb

## 2019-01-01 DIAGNOSIS — Z6832 Body mass index (BMI) 32.0-32.9, adult: Secondary | ICD-10-CM | POA: Insufficient documentation

## 2019-01-01 DIAGNOSIS — I10 Essential (primary) hypertension: Secondary | ICD-10-CM

## 2019-01-01 DIAGNOSIS — R7303 Prediabetes: Secondary | ICD-10-CM

## 2019-01-01 DIAGNOSIS — E782 Mixed hyperlipidemia: Secondary | ICD-10-CM | POA: Insufficient documentation

## 2019-01-01 DIAGNOSIS — Z0001 Encounter for general adult medical examination with abnormal findings: Secondary | ICD-10-CM

## 2019-01-01 DIAGNOSIS — J301 Allergic rhinitis due to pollen: Secondary | ICD-10-CM | POA: Insufficient documentation

## 2019-01-01 DIAGNOSIS — K219 Gastro-esophageal reflux disease without esophagitis: Secondary | ICD-10-CM

## 2019-01-01 DIAGNOSIS — Z23 Encounter for immunization: Secondary | ICD-10-CM

## 2019-01-01 LAB — BAYER DCA HB A1C WAIVED: HB A1C (BAYER DCA - WAIVED): 5.6 % (ref <7.0)

## 2019-01-01 MED ORDER — OMEPRAZOLE 40 MG PO CPDR: 40.0000 mg | DELAYED_RELEASE_CAPSULE | Freq: Every day | ORAL | 3 refills | Status: DC

## 2019-01-01 MED ORDER — FLUTICASONE PROPIONATE 50 MCG/ACT NA SUSP: 2.0000 | Freq: Every day | NASAL | 1 refills | Status: DC

## 2019-01-01 MED ORDER — ENALAPRIL MALEATE 2.5 MG PO TABS: 2.5000 mg | ORAL_TABLET | Freq: Every day | ORAL | 3 refills | Status: DC

## 2019-01-01 MED ORDER — CETIRIZINE HCL 10 MG PO TABS: 10.0000 mg | ORAL_TABLET | Freq: Every day | ORAL | 5 refills | Status: DC

## 2019-01-01 NOTE — Progress Notes (Signed)
Normal A1C, keep up the great work.

## 2019-01-01 NOTE — Progress Notes (Signed)
Subjective:  Patient ID: James Snyder, male    DOB: 04/08/1975, 43 y.o.   MRN: [id number]  Patient Care Team: Dettinger, Elige RadonJoshua A, MD as PCP - General (Family Medicine) Wyline MoodBranch, Dorothe PeaJonathan F, MD as PCP - Cardiology (Cardiology)   Chief Complaint:  Annual Exam   HPI: James Snyder is a 43 y.o. male presenting on 01/01/2019 for Annual Exam   Pt presents today for his annual physical exam. Pt states he has been doing very well. He recently underwent a cardiac ablation for SVT in November. States his HR has remained below 90 unless exercising since the procedure. He saw Dr. Johney FrameAllred on 12/16/2018 and is scheduled to see Dr. Wyline MoodBranch in February. No chest pain, shortness of breath, dizziness, palpitations, diaphoresis, or syncope. He has changed his diet. He is waiting to restart his exercise routine, waiting to be cleared by cardiology. He is compliant with his blood pressure and cholesterol medications without associated side effects. States he does have seasonal allergies with rhinorrhea. He uses Zyrtec and Flonase for this and tolerates it well. He does have GERD that is well controlled by omeprazole. No sings of esophagitis. He does have an intermittent pain in his left flank that comes and goes. No urinary symptoms. States he will take tylenol and this controls the pain. No radiation of the pain. Movement can increase the pain.     Relevant past medical, surgical, family, and social history reviewed and updated as indicated.  Allergies and medications reviewed and updated. Date reviewed: Chart in Epic.   Past Medical History:  Diagnosis Date  . Allergy   . Erectile dysfunction   . GERD (gastroesophageal reflux disease)   . Hyperlipidemia   . Hypertension   . SVT (supraventricular tachycardia) (HCC)     Past Surgical History:  Procedure Laterality Date  . NO PAST SURGERIES    . SVT ABLATION N/A 11/18/2018   Procedure: SVT ABLATION;  Surgeon: Hillis RangeAllred, James, MD;  Location: [facility]  INVASIVE CV LAB;  Service: Cardiovascular;  Laterality: N/A;    Social History   Socioeconomic History  . Marital status: Married    Spouse name: Not on file  . Number of children: Not on file  . Years of education: Not on file  . Highest education level: Not on file  Occupational History  . Not on file  Tobacco Use  . Smoking status: Former Smoker    Types: Cigars  . Smokeless tobacco: Former NeurosurgeonUser  . Tobacco comment: every once in a while  Substance and Sexual Activity  . Alcohol use: No    Alcohol/week: 0.0 standard drinks  . Drug use: Not on file  . Sexual activity: Not on file  Other Topics Concern  . Not on file  Social History Narrative   Lives in [place] with wife and son.   Works as a Emergency planning/management officerpolice officer in [place]   Social Determinants of Health   Financial Resource Strain:   . Difficulty of Paying Living Expenses: Not on file  Food Insecurity:   . Worried About Programme researcher, broadcasting/film/videounning Out of Food in the Last Year: Not on file  . Ran Out of Food in the Last Year: Not on file  Transportation Needs:   . Lack of Transportation (Medical): Not on file  . Lack of Transportation (Non-Medical): Not on file  Physical Activity:   . Days of Exercise per Week: Not on file  . Minutes of Exercise per Session: Not on file  Stress:   .  Feeling of Stress : Not on file  Social Connections:   . Frequency of Communication with Friends and Family: Not on file  . Frequency of Social Gatherings with Friends and Family: Not on file  . Attends Religious Services: Not on file  . Active Member of Clubs or Organizations: Not on file  . Attends Archivist Meetings: Not on file  . Marital Status: Not on file  Intimate Partner Violence:   . Fear of Current or Ex-Partner: Not on file  . Emotionally Abused: Not on file  . Physically Abused: Not on file  . Sexually Abused: Not on file    Outpatient Encounter Medications as of 01/01/2019  Medication Sig  . baclofen (LIORESAL) 10 MG tablet  Take 1 tablet (10 mg total) by mouth 3 (three) times daily. (Patient taking differently: Take 10 mg by mouth 3 (three) times daily as needed for muscle spasms. )  . cetirizine (ZYRTEC) 10 MG tablet Take 1 tablet (10 mg total) by mouth at bedtime.  . enalapril (VASOTEC) 2.5 MG tablet Take 1 tablet (2.5 mg total) by mouth daily.  . fluticasone (FLONASE) 50 MCG/ACT nasal spray Place 2 sprays into both nostrils daily.  [name] omeprazole (PRILOSEC) 40 MG capsule Take 1 capsule (40 mg total) by mouth daily.  . pravastatin (PRAVACHOL) 40 MG tablet Take 1 tablet (40 mg total) by mouth every evening.  . sildenafil (VIAGRA) 100 MG tablet Take 1 tablet (100 mg total) by mouth daily as needed. (Patient taking differently: Take 100 mg by mouth daily as needed for erectile dysfunction. )  . valACYclovir (VALTREX) 1000 MG tablet TAKE 1 TABLET BY MOUTH EVERY DAY  . [DISCONTINUED] cetirizine (ZYRTEC) 10 MG tablet TAKE 1 TABLET BY MOUTH EVERY DAY (Patient taking differently: Take 10 mg by mouth at bedtime. )  . [DISCONTINUED] enalapril (VASOTEC) 2.5 MG tablet TAKE 1 TABLET BY MOUTH EVERY DAY (Patient taking differently: Take 2.5 mg by mouth daily. )  . [DISCONTINUED] fluticasone (FLONASE) 50 MCG/ACT nasal spray PLACE 2 SPRAYS INTO BOTH NOSTRILS DAILY. (Patient taking differently: Place 2 sprays into both nostrils daily as needed for allergies. )  . [DISCONTINUED] omeprazole (PRILOSEC) 40 MG capsule Take 1 capsule (40 mg total) by mouth daily.  . [DISCONTINUED] metroNIDAZOLE (FLAGYL) 500 MG tablet Take 1 tablet (500 mg total) by mouth 2 (two) times daily.  . [DISCONTINUED] rifaximin (XIFAXAN) 200 MG tablet Take 1 tablet (200 mg total) by mouth 3 (three) times daily.   No facility-administered encounter medications on file as of 01/01/2019.    Allergies  Allergen Reactions  . Penicillins Hives    Did it involve swelling of the face/tongue/throat, SOB, or low BP? No Did it involve sudden or severe rash/hives, skin  peeling, or any reaction on the inside of your mouth or nose? No Did you need to seek medical attention at a hospital or doctor's office? No When did it last happen?Childhood If all above answers are "NO", may proceed with cephalosporin use.    Review of Systems  Constitutional: Negative for activity change, appetite change, chills, diaphoresis, fatigue, fever and unexpected weight change.  HENT: Positive for congestion, postnasal drip and rhinorrhea. Negative for sore throat, trouble swallowing and voice change.   Eyes: Negative.  Negative for photophobia and visual disturbance.  Respiratory: Negative for cough, choking, chest tightness, shortness of breath and wheezing.   Cardiovascular: Negative for chest pain, palpitations and leg swelling.  Gastrointestinal: Negative for abdominal distention, abdominal pain, anal bleeding, blood  in stool, constipation, diarrhea, nausea and vomiting.  Endocrine: Negative.  Negative for cold intolerance, heat intolerance, polyphagia and polyuria.  Genitourinary: Negative for decreased urine volume, difficulty urinating, discharge, dysuria, flank pain, frequency, genital sores, hematuria, penile pain, penile swelling, scrotal swelling, testicular pain and urgency.  Musculoskeletal: Positive for myalgias (left flank). Negative for arthralgias, back pain, gait problem, joint swelling, neck pain and neck stiffness.  Skin: Negative.  Negative for color change and rash.  Allergic/Immunologic: Negative.   Neurological: Negative for dizziness, tremors, seizures, syncope, facial asymmetry, speech difficulty, weakness, light-headedness, numbness and headaches.  Hematological: Negative.  Does not bruise/bleed easily.  Psychiatric/Behavioral: Negative for confusion, hallucinations, sleep disturbance and suicidal ideas.  All other systems reviewed and are negative.       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  Constitutional:      General: He is not in acute distress.    Appearance: Normal appearance. He is well-developed, well-groomed and overweight. He is not ill-appearing, toxic-appearing or diaphoretic.  HENT:     Head: Normocephalic and atraumatic.     Jaw: There is normal jaw occlusion.     Right Ear: Hearing normal. No decreased hearing noted.     Left Ear: Hearing normal. No decreased hearing noted.     Nose: Nose normal.     Mouth/Throat:     Lips: Pink.     Mouth: Mucous membranes are moist.     Pharynx: Oropharynx is clear. Uvula midline.  Eyes:     General: Lids are normal.     Extraocular Movements: Extraocular movements intact.     Conjunctiva/sclera: Conjunctivae normal.     Pupils: Pupils are equal, round, and reactive to light.  Neck:     Thyroid: No thyroid mass, thyromegaly or thyroid tenderness.     Vascular: No carotid bruit or JVD.     Trachea: Trachea and phonation normal.  Cardiovascular:     Rate and Rhythm: Normal rate and regular rhythm.     Chest Wall: PMI is not displaced.     Pulses: Normal pulses.     Heart sounds: Normal heart sounds. No murmur. No friction rub. No gallop.   Pulmonary:     Effort: Pulmonary effort is normal. No respiratory distress.     Breath sounds: Normal breath sounds. No wheezing.  Abdominal:     General: Bowel sounds are normal. There is no distension or abdominal bruit.     Palpations: Abdomen is soft. There is no hepatomegaly, splenomegaly or mass.     Tenderness: There is no abdominal tenderness. There is no right CVA tenderness, left CVA tenderness, guarding or rebound.     Hernia: No hernia is present.  Musculoskeletal:        General: Normal range of  motion.     Cervical back: Normal range of motion and neck supple.     Right lower leg: No edema.     Left lower leg: No edema.  Lymphadenopathy:     Cervical: No cervical adenopathy.  Skin:    General: Skin is warm and dry.     Capillary Refill: Capillary refill takes less than 2 seconds.     Coloration: Skin is not cyanotic, jaundiced or pale.     Findings: No rash.  Neurological:     General: No focal deficit present.     Mental Status: He is alert and oriented to person, place, and time.     Cranial Nerves: Cranial nerves are intact. No cranial nerve deficit.     Sensory: Sensation is intact. No sensory deficit.     Motor: Motor function is intact. No weakness.     Coordination: Coordination is intact. Coordination normal.     Gait: Gait is intact. Gait normal.     Deep Tendon Reflexes: Reflexes are normal and symmetric. Reflexes normal.  Psychiatric:        Attention and Perception: Attention and perception normal.        Mood and Affect: Mood and affect normal.        Speech: Speech normal.        Behavior: Behavior normal. Behavior is cooperative.        Thought Content: Thought content normal.        Cognition and Memory: Cognition and memory normal.        Judgment: Judgment normal.     Results for orders placed or performed during the hospital encounter of 11/16/18  SARS CORONAVIRUS 2 (TAT 6-24 HRS) Nasopharyngeal Nasopharyngeal Swab   Specimen: Nasopharyngeal Swab  Result Value Ref Range   SARS Coronavirus 2 NEGATIVE NEGATIVE       Pertinent labs & imaging results that were available during my care of the patient were reviewed by me and considered in my medical decision making.  Assessment & Plan:  Hoa was seen today for annual exam.  Diagnoses and all orders for this visit:  Encounter for general adult medical examination with abnormal findings Health maintenance discussed in detail. Labs pending. Diet and exercise encouraged.  -     CBC with  Differential -     Comprehensive metabolic panel -     TSH -     Lipid panel  Essential hypertension BP well controlled. Changes were not made in regimen. Goal BP is 130/80. Pt aware to report any persistent high or low readings. DASH diet and exercise encouraged. Exercise at least 150 minutes per week and increase as tolerated. Goal BMI > 25. Stress management encouraged. Avoid nicotine and tobacco product use. Avoid excessive alcohol and NSAID's. Avoid more than 2000 mg of sodium daily. Medications as prescribed. Follow up as scheduled.  -     CBC with Differential -     Comprehensive metabolic panel -     TSH -     Lipid panel -     enalapril (VASOTEC) 2.5 MG tablet; Take 1 tablet (2.5 mg total) by mouth daily.  Mixed hyperlipidemia Diet encouraged - increase intake of fresh fruits and vegetables, increase intake of lean proteins. Bake, broil, or grill foods. Avoid fried, greasy, and fatty foods. Avoid fast foods. Increase intake of fiber-rich whole grains. Exercise encouraged - at least 150 minutes per week and advance as tolerated.  Goal BMI < 25. Continue medications as prescribed. Follow up in 3-6 months as discussed.  -     Lipid panel  BMI 32.0-32.9,adult Diet and exercise encouraged. Labs pending.  -     CBC with Differential -     Comprehensive metabolic panel -  TSH -     Lipid panel -     hgba1c  Pre-diabetes Diet controlled, will get follow up A1C today.  -     hgba1c  Gastroesophageal reflux disease without esophagitis No red flags present. Diet discussed. Avoid fried, spicy, fatty, greasy, and acidic foods. Avoid caffeine, nicotine, and alcohol. Do not eat 2-3 hours before bedtime and stay upright for at least 1-2 hours after eating. Eat small frequent meals. Avoid NSAID's like motrin and aleve. Medications as prescribed. Report any new or worsening symptoms. Follow up as discussed or sooner if needed.   -     omeprazole (PRILOSEC) 40 MG capsule; Take 1 capsule (40  mg total) by mouth daily.  Seasonal allergic rhinitis due to pollen -     fluticasone (FLONASE) 50 MCG/ACT nasal spray; Place 2 sprays into both nostrils daily. -     cetirizine (ZYRTEC) 10 MG tablet; Take 1 tablet (10 mg total) by mouth at bedtime.  Influenza vaccine given today.   Continue all other maintenance medications.  Follow up plan: Return in 6 months (on 07/02/2019), or if symptoms worsen or fail to improve, for HTN.  Continue healthy lifestyle choices, including diet (rich in fruits, vegetables, and lean proteins, and low in salt and simple carbohydrates) and exercise (at least 30 minutes of moderate physical activity daily).  Educational handout given for health maintenance  The above assessment and management plan was discussed with the patient. The patient verbalized understanding of and has agreed to the management plan. Patient is aware to call the clinic if they develop any new symptoms or if symptoms persist or worsen. Patient is aware when to return to the clinic for a follow-up visit. Patient educated on when it is appropriate to go to the emergency department.   Kari Baars, FNP-C [facility] [phone]

## 2019-01-01 NOTE — Patient Instructions (Signed)

## 2019-01-02 ENCOUNTER — Other Ambulatory Visit: Payer: Self-pay | Admitting: Cardiology

## 2019-01-02 LAB — COMPREHENSIVE METABOLIC PANEL
ALT: 19 IU/L (ref 0–44)
AST: 16 IU/L (ref 0–40)
Albumin/Globulin Ratio: 1.8 (ref 1.2–2.2)
Albumin: 4.6 g/dL (ref 4.0–5.0)
Alkaline Phosphatase: 67 IU/L (ref 39–117)
BUN/Creatinine Ratio: 10 (ref 9–20)
BUN: 9 mg/dL (ref 6–24)
Bilirubin Total: 0.6 mg/dL (ref 0.0–1.2)
CO2: 21 mmol/L (ref 20–29)
Calcium: 9.6 mg/dL (ref 8.7–10.2)
Chloride: 102 mmol/L (ref 96–106)
Creatinine, Ser: 0.94 mg/dL (ref 0.76–1.27)
GFR calc Af Amer: 114 mL/min/{1.73_m2} (ref >59)
GFR calc non Af Amer: 98 mL/min/{1.73_m2} (ref >59)
Globulin, Total: 2.6 g/dL (ref 1.5–4.5)
Glucose: 82 mg/dL (ref 65–99)
Potassium: 4.6 mmol/L (ref 3.5–5.2)
Sodium: 137 mmol/L (ref 134–144)
Total Protein: 7.2 g/dL (ref 6.0–8.5)

## 2019-01-02 LAB — CBC WITH DIFFERENTIAL/PLATELET
Basophils Absolute: 0 10*3/uL (ref 0.0–0.2)
Basos: 1 %
EOS (ABSOLUTE): 0 10*3/uL (ref 0.0–0.4)
Eos: 1 %
Hematocrit: 46.9 % (ref 37.5–51.0)
Hemoglobin: 15.6 g/dL (ref 13.0–17.7)
Immature Grans (Abs): 0.1 10*3/uL (ref 0.0–0.1)
Immature Granulocytes: 1 %
Lymphocytes Absolute: 2.5 10*3/uL (ref 0.7–3.1)
Lymphs: 41 %
MCH: 29.5 pg (ref 26.6–33.0)
MCHC: 33.3 g/dL (ref 31.5–35.7)
MCV: 89 fL (ref 79–97)
Monocytes Absolute: 0.7 10*3/uL (ref 0.1–0.9)
Monocytes: 12 %
Neutrophils Absolute: 2.7 10*3/uL (ref 1.4–7.0)
Neutrophils: 44 %
Platelets: 325 10*3/uL (ref 150–450)
RBC: 5.28 x10E6/uL (ref 4.14–5.80)
RDW: 15.9 % — ABNORMAL HIGH (ref 11.6–15.4)
WBC: 6 10*3/uL (ref 3.4–10.8)

## 2019-01-02 LAB — TSH: TSH: 0.697 u[IU]/mL (ref 0.450–4.500)

## 2019-01-02 LAB — LIPID PANEL
Chol/HDL Ratio: 4.5 ratio (ref 0.0–5.0)
Cholesterol, Total: 178 mg/dL (ref 100–199)
HDL: 40 mg/dL (ref >39)
LDL Chol Calc (NIH): 99 mg/dL (ref 0–99)
Triglycerides: 229 mg/dL — ABNORMAL HIGH (ref 0–149)
VLDL Cholesterol Cal: 39 mg/dL (ref 5–40)

## 2019-01-02 NOTE — Progress Notes (Signed)
A1C normal.  Liver and kidney functions are normal. Glucose is normal.  RDW was slightly elevated but not concerning as these variations can be normal.  Thyroid function is normal.  Triglycerides were elevated at 229. Avoid fried, greasy, fatty, and fast foods. Lean proteins like chicken, fish, and [place] are better than red meats. Will recheck in 9-12 months.

## 2019-01-04 ENCOUNTER — Other Ambulatory Visit: Payer: Self-pay | Admitting: Cardiology

## 2019-01-04 ENCOUNTER — Encounter: Payer: PRIVATE HEALTH INSURANCE | Admitting: Family Medicine

## 2019-01-13 ENCOUNTER — Ambulatory Visit: Payer: PRIVATE HEALTH INSURANCE | Attending: Internal Medicine

## 2019-01-13 ENCOUNTER — Other Ambulatory Visit: Payer: Self-pay

## 2019-01-13 DIAGNOSIS — Z20822 Contact with and (suspected) exposure to covid-19: Secondary | ICD-10-CM

## 2019-01-14 LAB — NOVEL CORONAVIRUS, NAA: SARS-CoV-2, NAA: DETECTED — AB

## 2019-01-15 ENCOUNTER — Encounter: Payer: Self-pay | Admitting: Infectious Diseases

## 2019-01-21 ENCOUNTER — Encounter: Payer: Self-pay | Admitting: Family Medicine

## 2019-01-21 ENCOUNTER — Ambulatory Visit (INDEPENDENT_AMBULATORY_CARE_PROVIDER_SITE_OTHER): Payer: PRIVATE HEALTH INSURANCE | Admitting: Family Medicine

## 2019-01-21 DIAGNOSIS — R3989 Other symptoms and signs involving the genitourinary system: Secondary | ICD-10-CM

## 2019-01-21 MED ORDER — CIPROFLOXACIN HCL 500 MG PO TABS: 500.0000 mg | ORAL_TABLET | Freq: Two times a day (BID) | ORAL | 0 refills | Status: DC

## 2019-01-21 NOTE — Progress Notes (Signed)
Virtual Visit via Telephone Note  I connected with James Snyder on 01/21/19 at 4:42 PM by telephone and verified that I am speaking with the correct person using two identifiers. James Snyder is currently located at home and nobody is currently with him during this visit. The provider, Gwenlyn Fudge, FNP is located in their office at time of visit.  I discussed the limitations, risks, security and privacy concerns of performing an evaluation and management service by telephone and the availability of in person appointments. I also discussed with the patient that there may be a patient responsible charge related to this service. The patient expressed understanding and agreed to proceed.  Subjective: PCP: Dettinger, Elige Radon, MD  Chief Complaint  Patient presents with  . Urinary Tract Infection   Urinary Tract Infection: Patient complains of dysuria, left flank pain, frequency, nausea and urgency He has had symptoms for 3 days. Patient denies fever. Patient does not have a history of recurrent UTI.  Patient does not have a history of pyelonephritis.    ROS: Per HPI  Current Outpatient Medications:  .  baclofen (LIORESAL) 10 MG tablet, Take 1 tablet (10 mg total) by mouth 3 (three) times daily. (Patient taking differently: Take 10 mg by mouth 3 (three) times daily as needed for muscle spasms. ), Disp: 30 each, Rfl: 0 .  cetirizine (ZYRTEC) 10 MG tablet, Take 1 tablet (10 mg total) by mouth at bedtime., Disp: 90 tablet, Rfl: 5 .  enalapril (VASOTEC) 2.5 MG tablet, Take 1 tablet (2.5 mg total) by mouth daily., Disp: 90 tablet, Rfl: 3 .  fluticasone (FLONASE) 50 MCG/ACT nasal spray, Place 2 sprays into both nostrils daily., Disp: 48 g, Rfl: 1 .  metoprolol tartrate (LOPRESSOR) 25 MG tablet, TAKE 25 MG IN THE MORNING AND 12.5 MG IN THE EVENING, Disp: 135 tablet, Rfl: 1 .  omeprazole (PRILOSEC) 40 MG capsule, Take 1 capsule (40 mg total) by mouth daily., Disp: 90 capsule, Rfl: 3 .   pravastatin (PRAVACHOL) 40 MG tablet, TAKE 1 TABLET BY MOUTH EVERY DAY IN THE EVENING, Disp: 90 tablet, Rfl: 1 .  sildenafil (VIAGRA) 100 MG tablet, Take 1 tablet (100 mg total) by mouth daily as needed. (Patient taking differently: Take 100 mg by mouth daily as needed for erectile dysfunction. ), Disp: 4 tablet, Rfl: 5 .  valACYclovir (VALTREX) 1000 MG tablet, TAKE 1 TABLET BY MOUTH EVERY DAY, Disp: 30 tablet, Rfl: 2  Allergies  Allergen Reactions  . Penicillins Hives    Did it involve swelling of the face/tongue/throat, SOB, or low BP? No Did it involve sudden or severe rash/hives, skin peeling, or any reaction on the inside of your mouth or nose? No Did you need to seek medical attention at a hospital or doctor's office? No When did it last happen?Childhood If all above answers are "NO", may proceed with cephalosporin use.   Past Medical History:  Diagnosis Date  . Allergy   . Erectile dysfunction   . GERD (gastroesophageal reflux disease)   . Hyperlipidemia   . Hypertension   . SVT (supraventricular tachycardia) (HCC)     Observations/Objective: A&O  No respiratory distress or wheezing audible over the phone Mood, judgement, and thought processes all WNL  Assessment and Plan: 1. Suspected UTI - Education provided on UTIs. Encouraged adequate hydration. Advised to come into the office if his symptoms do not improve so that a kidney stone can be ruled in or out. He will be out  of his COVID-19 quarantine after this weekend. - ciprofloxacin (CIPRO) 500 MG tablet; Take 1 tablet (500 mg total) by mouth 2 (two) times daily.  Dispense: 10 tablet; Refill: 0   Follow Up Instructions:  I discussed the assessment and treatment plan with the patient. The patient was provided an opportunity to ask questions and all were answered. The patient agreed with the plan and demonstrated an understanding of the instructions.   The patient was advised to call back or seek an in-person  evaluation if the symptoms worsen or if the condition fails to improve as anticipated.  The above assessment and management plan was discussed with the patient. The patient verbalized understanding of and has agreed to the management plan. Patient is aware to call the clinic if symptoms persist or worsen. Patient is aware when to return to the clinic for a follow-up visit. Patient educated on when it is appropriate to go to the emergency department.   Time call ended: 4:51 PM  I provided 12 minutes of non-face-to-face time during this encounter.  Hendricks Limes, MSN, APRN, FNP-C [facility] 01/21/19

## 2019-01-21 NOTE — Patient Instructions (Signed)
Urinary Tract Infection, Adult A urinary tract infection (UTI) is an infection of any part of the urinary tract. The urinary tract includes:  The kidneys.  The ureters.  The bladder.  The urethra. These organs make, store, and get rid of pee (urine) in the body. What are the causes? This is caused by germs (bacteria) in your genital area. These germs grow and cause swelling (inflammation) of your urinary tract. What increases the risk? You are more likely to develop this condition if:  You have a small, thin tube (catheter) to drain pee.  You cannot control when you pee or poop (incontinence).  You are male, and: ? You use these methods to prevent pregnancy:  A medicine that kills sperm (spermicide).  A device that blocks sperm (diaphragm). ? You have low levels of a male hormone (estrogen). ? You are pregnant.  You have genes that add to your risk.  You are sexually active.  You take antibiotic medicines.  You have trouble peeing because of: ? A prostate that is bigger than normal, if you are male. ? A blockage in the part of your body that drains pee from the bladder (urethra). ? A kidney stone. ? A nerve condition that affects your bladder (neurogenic bladder). ? Not getting enough to drink. ? Not peeing often enough.  You have other conditions, such as: ? Diabetes. ? A weak disease-fighting system (immune system). ? Sickle cell disease. ? Gout. ? Injury of the spine. What are the signs or symptoms? Symptoms of this condition include:  Needing to pee right away (urgently).  Peeing often.  Peeing small amounts often.  Pain or burning when peeing.  Blood in the pee.  Pee that smells bad or not like normal.  Trouble peeing.  Pee that is cloudy.  Fluid coming from the vagina, if you are male.  Pain in the belly or lower back. Other symptoms include:  Throwing up (vomiting).  No urge to eat.  Feeling mixed up (confused).  Being tired  and grouchy (irritable).  A fever.  Watery poop (diarrhea). How is this treated? This condition may be treated with:  Antibiotic medicine.  Other medicines.  Drinking enough water. Follow these instructions at home:  Medicines  Take over-the-counter and prescription medicines only as told by your doctor.  If you were prescribed an antibiotic medicine, take it as told by your doctor. Do not stop taking it even if you start to feel better. General instructions  Make sure you: ? Pee until your bladder is empty. ? Do not hold pee for a long time. ? Empty your bladder after sex. ? Wipe from front to back after pooping if you are a male. Use each tissue one time when you wipe.  Drink enough fluid to keep your pee pale yellow.  Keep all follow-up visits as told by your doctor. This is important. Contact a doctor if:  You do not get better after 1-2 days.  Your symptoms go away and then come back. Get help right away if:  You have very bad back pain.  You have very bad pain in your lower belly.  You have a fever.  You are sick to your stomach (nauseous).  You are throwing up. Summary  A urinary tract infection (UTI) is an infection of any part of the urinary tract.  This condition is caused by germs in your genital area.  There are many risk factors for a UTI. These include having a small, thin   tube to drain pee and not being able to control when you pee or poop.  Treatment includes antibiotic medicines for germs.  Drink enough fluid to keep your pee pale yellow. This information is not intended to replace advice given to you by your health care provider. Make sure you discuss any questions you have with your health care provider. Document Revised: 12/18/2017 Document Reviewed: 07/10/2017 Elsevier Patient Education  2020 [employer]  

## 2019-02-10 ENCOUNTER — Other Ambulatory Visit: Payer: Self-pay | Admitting: Family Medicine

## 2019-02-10 DIAGNOSIS — N529 Male erectile dysfunction, unspecified: Secondary | ICD-10-CM

## 2019-02-11 ENCOUNTER — Telehealth: Payer: Self-pay | Admitting: *Deleted

## 2019-02-11 NOTE — Telephone Encounter (Signed)
Request for sildenafil done in refill pool

## 2019-04-01 ENCOUNTER — Other Ambulatory Visit: Payer: Self-pay | Admitting: Family Medicine

## 2019-04-01 DIAGNOSIS — J301 Allergic rhinitis due to pollen: Secondary | ICD-10-CM

## 2019-04-01 DIAGNOSIS — I10 Essential (primary) hypertension: Secondary | ICD-10-CM

## 2019-04-16 ENCOUNTER — Encounter: Payer: Self-pay | Admitting: Cardiology

## 2019-04-16 ENCOUNTER — Other Ambulatory Visit: Payer: Self-pay

## 2019-04-16 ENCOUNTER — Ambulatory Visit (INDEPENDENT_AMBULATORY_CARE_PROVIDER_SITE_OTHER): Payer: PRIVATE HEALTH INSURANCE | Admitting: Cardiology

## 2019-04-16 VITALS — BP 118/82 | HR 103 | Ht 69.0 in | Wt 203.8 lb

## 2019-04-16 DIAGNOSIS — I471 Supraventricular tachycardia: Secondary | ICD-10-CM | POA: Diagnosis not present

## 2019-04-16 DIAGNOSIS — E782 Mixed hyperlipidemia: Secondary | ICD-10-CM | POA: Diagnosis not present

## 2019-04-16 NOTE — Progress Notes (Signed)
Clinical Summary James Snyder is a 44 y.o.male seen today for follow up of the following medical problems.   1. PSVT/AVNRT - prior  ER visit  04/2016 with papitations - EKG confirms SVT, converted with IV adenosine. K 3.3, Mg 2.1, TSH 1.51 - reports prior palpitations, but this was most severe episode. More intense, SOB. Occurred while at church. Lightheaded/dizzy. Reports had some energy drinks around that time.  05/10/18 seen in [facility] ER with SVT, converted with adenosine. K was 3.7, Mg not checked    - s/p ablation 11/2018 by Dr Rayann Heman - has done very well since with no recurrent symptoms, was able to come off metoprolol    2. Hyperlipidemia - 12/2018 TC 178 TG 229 HDL 40 LDL 99 - compliatn with pravastatin    Had COVID in 12/2018   SH: works as Editor, commissioning at [employer]   Past Medical History:  Diagnosis Date  . Allergy   . Erectile dysfunction   . GERD (gastroesophageal reflux disease)   . Hyperlipidemia   . Hypertension   . SVT (supraventricular tachycardia) (HCC)      Allergies  Allergen Reactions  . Penicillins Hives    Did it involve swelling of the face/tongue/throat, SOB, or low BP? No Did it involve sudden or severe rash/hives, skin peeling, or any reaction on the inside of your mouth or nose? No Did you need to seek medical attention at a hospital or doctor's office? No When did it last happen?Childhood If all above answers are "NO", may proceed with cephalosporin use.     Current Outpatient Medications  Medication Sig Dispense Refill  . baclofen (LIORESAL) 10 MG tablet Take 1 tablet (10 mg total) by mouth 3 (three) times daily. (Patient taking differently: Take 10 mg by mouth 3 (three) times daily as needed for muscle spasms. ) 30 each 0  . cetirizine (ZYRTEC) 10 MG tablet TAKE 1 TABLET BY MOUTH EVERY DAY 90 tablet 1  . ciprofloxacin (CIPRO) 500 MG tablet Take 1 tablet (500 mg total) by mouth 2 (two) times daily. 10  tablet 0  . enalapril (VASOTEC) 2.5 MG tablet TAKE 1 TABLET BY MOUTH EVERY DAY 90 tablet 0  . fluticasone (FLONASE) 50 MCG/ACT nasal spray Place 2 sprays into both nostrils daily. 48 g 1  . metoprolol tartrate (LOPRESSOR) 25 MG tablet TAKE 25 MG IN THE MORNING AND 12.5 MG IN THE EVENING 135 tablet 1  . omeprazole (PRILOSEC) 40 MG capsule Take 1 capsule (40 mg total) by mouth daily. 90 capsule 3  . pravastatin (PRAVACHOL) 40 MG tablet TAKE 1 TABLET BY MOUTH EVERY DAY IN THE EVENING 90 tablet 1  . sildenafil (VIAGRA) 100 MG tablet TAKE 1 TABLET BY MOUTH DAILY AS NEEDED 6 tablet 3  . valACYclovir (VALTREX) 1000 MG tablet TAKE 1 TABLET BY MOUTH EVERY DAY 30 tablet 2   No current facility-administered medications for this visit.     Past Surgical History:  Procedure Laterality Date  . NO PAST SURGERIES    . SVT ABLATION N/A 11/18/2018   Procedure: SVT ABLATION;  Surgeon: Thompson Grayer, MD;  Location: [facility];  Service: Cardiovascular;  Laterality: N/A;     Allergies  Allergen Reactions  . Penicillins Hives    Did it involve swelling of the face/tongue/throat, SOB, or low BP? No Did it involve sudden or severe rash/hives, skin peeling, or any reaction on the inside of your mouth or nose? No Did  you need to seek medical attention at a hospital or doctor's office? No When did it last happen?Childhood If all above answers are "NO", may proceed with cephalosporin use.      Family History  Problem Relation Age of Onset  . Cancer Mother   . Heart disease Father   . Stroke Brother      Social History James Snyder reports that he has quit smoking. His smoking use included cigars. He has quit using smokeless tobacco. James Snyder reports no history of alcohol use.   Review of Systems CONSTITUTIONAL: No weight loss, fever, chills, weakness or fatigue.  HEENT: Eyes: No visual loss, blurred vision, double vision or yellow sclerae.No hearing loss, sneezing, congestion, runny  nose or sore throat.  SKIN: No rash or itching.  CARDIOVASCULAR: per hpi RESPIRATORY: No shortness of breath, cough or sputum.  GASTROINTESTINAL: No anorexia, nausea, vomiting or diarrhea. No abdominal pain or blood.  GENITOURINARY: No burning on urination, no polyuria NEUROLOGICAL: No headache, dizziness, syncope, paralysis, ataxia, numbness or tingling in the extremities. No change in bowel or bladder control.  MUSCULOSKELETAL: No muscle, back pain, joint pain or stiffness.  LYMPHATICS: No enlarged nodes. No history of splenectomy.  PSYCHIATRIC: No history of depression or anxiety.  ENDOCRINOLOGIC: No reports of sweating, cold or heat intolerance. No polyuria or polydipsia.  [name]   Physical Examination Today's Vitals   04/16/19 1002  BP: 118/82  Pulse: (!) 103  SpO2: 94%  Weight: 203 lb 12.8 oz (92.4 kg)  Height: 5\' 9"  (1.753 m)   Body mass index is 30.1 kg/m.  Gen: resting comfortably, no acute distress HEENT: no scleral icterus, pupils equal round and reactive, no palptable cervical adenopathy,  CV: RRR, no m/r/g, no jvd Resp: Clear to auscultation bilaterally GI: abdomen is soft, non-tender, non-distended, normal bowel sounds, no hepatosplenomegaly MSK: extremities are warm, no edema.  Skin: warm, no rash Neuro:  no focal deficits Psych: appropriate affect   Diagnostic Studies  11/2018 ablation POSTPROCEDURE DIAGNOSIS:  1. Atypical AV nodal reentrant tachycardia  2. Classic AV nodal reentrant tachycardia 3. Nonsustained atrial tachycardia  PROCEDURES:  1. Comprehensive EP study.  2. Coronary sinus pacing and recording.  3. 3D Mapping of supraventricular tachycardia.  4. Radiofrequency ablation of supraventricular tachycardia.  5. Arrhythmia induction with isuprel infused  6. R femoral vein ultrasound access  Assessment and Plan  1. PSVT/AVNRT - doing well s/p ablation, no longer requiring beta blocker - continue to monitor at this time  2.  Hyperlipidemia - LDL at goal, we discussed dietary modifications to lower TGs. Continue statin.    F/u 1 year   12/2018, M.D.

## 2019-04-16 NOTE — Patient Instructions (Signed)

## 2019-05-06 ENCOUNTER — Other Ambulatory Visit: Payer: Self-pay | Admitting: Family Medicine

## 2019-06-13 ENCOUNTER — Other Ambulatory Visit: Payer: Self-pay | Admitting: Family Medicine

## 2019-06-13 DIAGNOSIS — N529 Male erectile dysfunction, unspecified: Secondary | ICD-10-CM

## 2019-06-25 ENCOUNTER — Other Ambulatory Visit: Payer: Self-pay | Admitting: Cardiology

## 2019-10-21 ENCOUNTER — Other Ambulatory Visit: Payer: Self-pay | Admitting: Family Medicine

## 2019-10-21 DIAGNOSIS — N529 Male erectile dysfunction, unspecified: Secondary | ICD-10-CM

## 2019-11-05 ENCOUNTER — Other Ambulatory Visit: Payer: Self-pay | Admitting: *Deleted

## 2019-11-05 DIAGNOSIS — J301 Allergic rhinitis due to pollen: Secondary | ICD-10-CM

## 2019-11-05 MED ORDER — CETIRIZINE HCL 10 MG PO TABS: 10.0000 mg | ORAL_TABLET | Freq: Every day | ORAL | 0 refills | Status: DC

## 2019-11-17 ENCOUNTER — Other Ambulatory Visit: Payer: Self-pay | Admitting: Family Medicine

## 2019-11-22 ENCOUNTER — Telehealth: Payer: Self-pay | Admitting: Cardiology

## 2019-11-22 NOTE — Telephone Encounter (Signed)
Patient called in regards to a recent bill that he has received. Would like to speak with someone regarding the bill.

## 2019-12-15 ENCOUNTER — Encounter: Payer: PRIVATE HEALTH INSURANCE | Admitting: Family Medicine

## 2019-12-17 ENCOUNTER — Encounter: Payer: PRIVATE HEALTH INSURANCE | Admitting: Family Medicine

## 2019-12-24 ENCOUNTER — Encounter: Payer: Self-pay | Admitting: Family Medicine

## 2019-12-24 ENCOUNTER — Ambulatory Visit (INDEPENDENT_AMBULATORY_CARE_PROVIDER_SITE_OTHER): Payer: PRIVATE HEALTH INSURANCE | Admitting: Family Medicine

## 2019-12-24 ENCOUNTER — Other Ambulatory Visit: Payer: Self-pay

## 2019-12-24 VITALS — BP 131/92 | HR 88 | Ht 69.0 in | Wt 193.0 lb

## 2019-12-24 DIAGNOSIS — K219 Gastro-esophageal reflux disease without esophagitis: Secondary | ICD-10-CM | POA: Diagnosis not present

## 2019-12-24 DIAGNOSIS — E782 Mixed hyperlipidemia: Secondary | ICD-10-CM

## 2019-12-24 DIAGNOSIS — R7303 Prediabetes: Secondary | ICD-10-CM

## 2019-12-24 DIAGNOSIS — Z0000 Encounter for general adult medical examination without abnormal findings: Secondary | ICD-10-CM

## 2019-12-24 DIAGNOSIS — I10 Essential (primary) hypertension: Secondary | ICD-10-CM | POA: Diagnosis not present

## 2019-12-24 DIAGNOSIS — J301 Allergic rhinitis due to pollen: Secondary | ICD-10-CM | POA: Diagnosis not present

## 2019-12-24 DIAGNOSIS — N529 Male erectile dysfunction, unspecified: Secondary | ICD-10-CM

## 2019-12-24 DIAGNOSIS — Z0001 Encounter for general adult medical examination with abnormal findings: Secondary | ICD-10-CM | POA: Diagnosis not present

## 2019-12-24 MED ORDER — PRAVASTATIN SODIUM 40 MG PO TABS: 40.0000 mg | ORAL_TABLET | Freq: Every day | ORAL | 3 refills | Status: DC

## 2019-12-24 MED ORDER — SILDENAFIL CITRATE 100 MG PO TABS: 100.0000 mg | ORAL_TABLET | Freq: Every day | ORAL | 3 refills | Status: DC | PRN

## 2019-12-24 MED ORDER — OMEPRAZOLE 40 MG PO CPDR: 40.0000 mg | DELAYED_RELEASE_CAPSULE | Freq: Every day | ORAL | 3 refills | Status: DC

## 2019-12-24 MED ORDER — CETIRIZINE HCL 10 MG PO TABS: 10.0000 mg | ORAL_TABLET | Freq: Every day | ORAL | 3 refills | Status: DC

## 2019-12-24 MED ORDER — ENALAPRIL MALEATE 2.5 MG PO TABS: 2.5000 mg | ORAL_TABLET | Freq: Every day | ORAL | 3 refills | Status: DC

## 2019-12-24 NOTE — Progress Notes (Signed)
BP (!) 131/92   Pulse 88   Ht 5' 9"  (1.753 m)   Wt 193 lb (87.5 kg)   SpO2 99%   BMI 28.50 kg/m    Subjective:   Patient ID: James Snyder, male    DOB: 05-11-1975, 44 y.o.   MRN: [id number]  HPI: James Snyder is a 44 y.o. male presenting on 12/24/2019 for Medical Management of Chronic Issues (CPE)   HPI Patient is coming in for checkup and brief physical Patient denies any chest pain, shortness of breath, headaches or vision issues, abdominal complaints, diarrhea, nausea, vomiting, or joint issues.  He still sexually active in relationship with his girlfriend, still going through divorce with his wife cheated on him.  He still taking the Valtrex and has not had any open sores from it.  Hypertension Patient is currently on enalapril, and their blood pressure today is 131/92. Patient denies any lightheadedness or dizziness. Patient denies headaches, blurred vision, chest pains, shortness of breath, or weakness. Denies any side effects from medication and is content with current medication.   Hyperlipidemia Patient is coming in for recheck of his hyperlipidemia. The patient is currently taking pravastatin. They deny any issues with myalgias or history of liver damage from it. They deny any focal numbness or weakness or chest pain.   Relevant past medical, surgical, family and social history reviewed and updated as indicated. Interim medical history since our last visit reviewed. Allergies and medications reviewed and updated.  Review of Systems  Constitutional: Negative for chills and fever.  HENT: Negative for ear pain and tinnitus.   Eyes: Negative for pain and discharge.  Respiratory: Negative for cough, shortness of breath and wheezing.   Cardiovascular: Negative for chest pain, palpitations and leg swelling.  Gastrointestinal: Negative for abdominal pain, blood in stool, constipation and diarrhea.  Genitourinary: Negative for dysuria and hematuria.  Musculoskeletal:  Negative for back pain, gait problem and myalgias.  Skin: Negative for rash.  Neurological: Negative for dizziness, weakness and headaches.  Psychiatric/Behavioral: Negative for suicidal ideas.  All other systems reviewed and are negative.   Per HPI unless specifically indicated above   Allergies as of 12/24/2019      Reactions   Penicillins Hives   Did it involve swelling of the face/tongue/throat, SOB, or low BP? No Did it involve sudden or severe rash/hives, skin peeling, or any reaction on the inside of your mouth or nose? No Did you need to seek medical attention at a hospital or doctor's office? No When did it last happen?Childhood If all above answers are "NO", may proceed with cephalosporin use.      Medication List       Accurate as of December 24, 2019 10:57 AM. If you have any questions, ask your nurse or doctor.        baclofen 10 MG tablet Commonly known as: LIORESAL Take 10 mg by mouth as needed for muscle spasms.   cetirizine 10 MG tablet Commonly known as: ZYRTEC Take 1 tablet (10 mg total) by mouth daily.   enalapril 2.5 MG tablet Commonly known as: VASOTEC TAKE 1 TABLET BY MOUTH EVERY DAY   fluticasone 50 MCG/ACT nasal spray Commonly known as: FLONASE Place 1-2 sprays into both nostrils as needed for allergies or rhinitis.   omeprazole 40 MG capsule Commonly known as: PRILOSEC Take 1 capsule (40 mg total) by mouth daily.   pravastatin 40 MG tablet Commonly known as: PRAVACHOL TAKE 1 TABLET BY MOUTH EVERY DAY  IN THE EVENING   sildenafil 100 MG tablet Commonly known as: VIAGRA TAKE 1 TABLET BY MOUTH DAILY AS NEEDED   valACYclovir 1000 MG tablet Commonly known as: VALTREX TAKE 1 TABLET BY MOUTH EVERY DAY        Objective:   BP (!) 131/92   Pulse 88   Ht 5' 9"  (1.753 m)   Wt 193 lb (87.5 kg)   SpO2 99%   BMI 28.50 kg/m   Wt Readings from Last 3 Encounters:  12/24/19 193 lb (87.5 kg)  04/16/19 203 lb 12.8 oz (92.4 kg)   01/01/19 195 lb (88.5 kg)    Physical Exam Vitals and nursing note reviewed.  Constitutional:      General: He is not in acute distress.    Appearance: He is well-developed and well-nourished. He is not diaphoretic.  Eyes:     General: No scleral icterus.    Extraocular Movements: EOM normal.     Conjunctiva/sclera: Conjunctivae normal.  Neck:     Thyroid: No thyromegaly.  Cardiovascular:     Rate and Rhythm: Normal rate and regular rhythm.     Pulses: Intact distal pulses.     Heart sounds: Normal heart sounds. No murmur heard.   Pulmonary:     Effort: Pulmonary effort is normal. No respiratory distress.     Breath sounds: Normal breath sounds. No wheezing.  Musculoskeletal:        General: No edema. Normal range of motion.     Cervical back: Neck supple.  Lymphadenopathy:     Cervical: No cervical adenopathy.  Skin:    General: Skin is warm and dry.     Findings: No rash.  Neurological:     Mental Status: He is alert and oriented to person, place, and time.     Coordination: Coordination normal.  Psychiatric:        Mood and Affect: Mood and affect normal.        Behavior: Behavior normal.       Assessment & Plan:   Problem List Items Addressed This Visit      Cardiovascular and Mediastinum   Essential hypertension   Relevant Medications   enalapril (VASOTEC) 2.5 MG tablet   pravastatin (PRAVACHOL) 40 MG tablet   sildenafil (VIAGRA) 100 MG tablet   Other Relevant Orders   CBC with Differential/Platelet   CMP14+EGFR     Respiratory   Seasonal allergic rhinitis due to pollen   Relevant Medications   cetirizine (ZYRTEC) 10 MG tablet     Digestive   Acid reflux   Relevant Medications   omeprazole (PRILOSEC) 40 MG capsule   Other Relevant Orders   CBC with Differential/Platelet     Other   Pre-diabetes   Mixed hyperlipidemia   Relevant Medications   enalapril (VASOTEC) 2.5 MG tablet   pravastatin (PRAVACHOL) 40 MG tablet   sildenafil (VIAGRA)  100 MG tablet   Other Relevant Orders   Lipid panel   Erectile dysfunction   Relevant Medications   sildenafil (VIAGRA) 100 MG tablet   Other Relevant Orders   Ambulatory referral to Urology    Other Visit Diagnoses    Well adult exam    -  Primary       Follow up plan: Return in about 1 year (around 12/23/2020), or if symptoms worsen or fail to improve, for Hypertension and prediabetes..  Counseling provided for all of the vaccine components No orders of the defined types were placed in this encounter.  Caryl Pina, MD [facility] 12/24/2019, 10:57 AM

## 2020-01-24 ENCOUNTER — Encounter: Payer: Self-pay | Admitting: Family Medicine

## 2020-06-06 ENCOUNTER — Other Ambulatory Visit: Payer: Self-pay | Admitting: Family Medicine

## 2020-06-06 NOTE — Telephone Encounter (Signed)
OV 12/24/19 rtc 1 yr

## 2020-07-05 ENCOUNTER — Encounter: Payer: Self-pay | Admitting: Family Medicine

## 2020-07-06 ENCOUNTER — Other Ambulatory Visit: Payer: Self-pay

## 2020-07-06 DIAGNOSIS — N529 Male erectile dysfunction, unspecified: Secondary | ICD-10-CM

## 2020-07-15 ENCOUNTER — Encounter: Payer: Self-pay | Admitting: Urology

## 2020-09-05 ENCOUNTER — Ambulatory Visit: Payer: PRIVATE HEALTH INSURANCE | Admitting: Urology

## 2020-09-18 NOTE — Progress Notes (Signed)
H&P  Chief Complaint: Abdominal and back pain  History of Present Illness: 45 year old male, police officer here in [place], sent by Dr. Louanne Skye for evaluation and management of history of trichomonas exposure.  The patient states that earlier this year he had a sexual encounter with his wife, who he was separated with.  Since then he has been divorced.  He does have another partner.  Apparently, he went to a urgent care center and was found to have trichomonas.  He was apparently adequately treated.  Since that time, he has had intermittent left flank pain as well as abdominal cramps and diarrhea.  He is quite concerned that he has persistent trichomonas or other STD.  He has no dysuria, urethral discharge.  Past Medical History:  Diagnosis Date   Allergy    Erectile dysfunction    GERD (gastroesophageal reflux disease)    Hyperlipidemia    Hypertension    SVT (supraventricular tachycardia) (HCC)     Past Surgical History:  Procedure Laterality Date   NO PAST SURGERIES     SVT ABLATION N/A 11/18/2018   Procedure: SVT ABLATION;  Surgeon: Hillis Range, MD;  Location: [facility];  Service: Cardiovascular;  Laterality: N/A;    Home Medications:  Allergies as of 09/19/2020       Reactions   Penicillins Hives   Did it involve swelling of the face/tongue/throat, SOB, or low BP? No Did it involve sudden or severe rash/hives, skin peeling, or any reaction on the inside of your mouth or nose? No Did you need to seek medical attention at a hospital or doctor's office? No When did it last happen?      Childhood If all above answers are "NO", may proceed with cephalosporin use.        Medication List        Accurate as of September 18, 2020  8:29 PM. If you have any questions, ask your nurse or doctor.          baclofen 10 MG tablet Commonly known as: LIORESAL Take 10 mg by mouth as needed for muscle spasms.   cetirizine 10 MG tablet Commonly known as:  ZYRTEC Take 1 tablet (10 mg total) by mouth daily.   enalapril 2.5 MG tablet Commonly known as: VASOTEC Take 1 tablet (2.5 mg total) by mouth daily.   fluticasone 50 MCG/ACT nasal spray Commonly known as: FLONASE Place 1-2 sprays into both nostrils as needed for allergies or rhinitis.   omeprazole 40 MG capsule Commonly known as: PRILOSEC Take 1 capsule (40 mg total) by mouth daily.   pravastatin 40 MG tablet Commonly known as: PRAVACHOL Take 1 tablet (40 mg total) by mouth daily.   sildenafil 100 MG tablet Commonly known as: VIAGRA Take 1 tablet (100 mg total) by mouth daily as needed.   valACYclovir 1000 MG tablet Commonly known as: VALTREX TAKE 1 TABLET BY MOUTH EVERY DAY        Allergies:  Allergies  Allergen Reactions   Penicillins Hives    Did it involve swelling of the face/tongue/throat, SOB, or low BP? No Did it involve sudden or severe rash/hives, skin peeling, or any reaction on the inside of your mouth or nose? No Did you need to seek medical attention at a hospital or doctor's office? No When did it last happen?      Childhood If all above answers are "NO", may proceed with cephalosporin use.    Family History  Problem Relation Age of  Onset   Cancer Mother    Heart disease Father    Stroke Brother     Social History:  reports that he has quit smoking. His smoking use included cigars. He has quit using smokeless tobacco. He reports that he does not drink alcohol. No history on file for drug use.  ROS: A complete review of systems was performed.  All systems are negative except for pertinent findings as noted.  Physical Exam:  Vital signs in last 24 hours: There were no vitals taken for this visit. Constitutional:  Alert and oriented, No acute distress Cardiovascular: Regular rate  Respiratory: Normal respiratory effort GI: Abdomen is soft, nontender, nondistended, no abdominal masses. No CVAT.  Genitourinary: Normal male phallus, testes are  descended bilaterally and non-tender and without masses, scrotum is normal in appearance without lesions or masses, perineum is normal on inspection.  There is no penile discharge.  Normal anal sphincter tone.  Prostate 30 g, symmetrical, nonnodular, nontender Lymphatic: No lymphadenopathy Neurologic: Grossly intact, no focal deficits Psychiatric: Normal mood and affect  I have reviewed prior pt notes  I have reviewed notes from referring/previous physicians  I have reviewed urinalysis results     Impression/Assessment:  1.  STD exposure.  No evidence of trichomonas but the patient is quite anxious about the presence of STD  2.  Multiple other complaints-left flank pain, abdominal pain.  No history of kidney stones  Plan:  1.  For the left flank pain, I will have him sent for ultrasound of the kidneys  2.  I reassured him that I do not think he has any more STD but I will send STD screening on the urine

## 2020-09-19 ENCOUNTER — Encounter: Payer: Self-pay | Admitting: Urology

## 2020-09-19 ENCOUNTER — Ambulatory Visit (INDEPENDENT_AMBULATORY_CARE_PROVIDER_SITE_OTHER): Payer: No Typology Code available for payment source | Admitting: Urology

## 2020-09-19 ENCOUNTER — Other Ambulatory Visit: Payer: Self-pay

## 2020-09-19 VITALS — BP 147/97 | HR 106

## 2020-09-19 DIAGNOSIS — Z118 Encounter for screening for other infectious and parasitic diseases: Secondary | ICD-10-CM | POA: Diagnosis not present

## 2020-09-19 DIAGNOSIS — R109 Unspecified abdominal pain: Secondary | ICD-10-CM | POA: Diagnosis not present

## 2020-09-19 DIAGNOSIS — Z112 Encounter for screening for other bacterial diseases: Secondary | ICD-10-CM | POA: Diagnosis not present

## 2020-09-19 DIAGNOSIS — N5201 Erectile dysfunction due to arterial insufficiency: Secondary | ICD-10-CM | POA: Diagnosis not present

## 2020-09-19 LAB — URINALYSIS, ROUTINE W REFLEX MICROSCOPIC
Bilirubin, UA: NEGATIVE
Glucose, UA: NEGATIVE
Ketones, UA: NEGATIVE
Leukocytes,UA: NEGATIVE
Nitrite, UA: NEGATIVE
Protein,UA: NEGATIVE
Specific Gravity, UA: >1.03 — ABNORMAL HIGH (ref 1.005–1.030)
Urobilinogen, Ur: 0.2 mg/dL (ref 0.2–1.0)
pH, UA: 5.5 (ref 5.0–7.5)

## 2020-09-19 LAB — MICROSCOPIC EXAMINATION
Bacteria, UA: NONE SEEN
Epithelial Cells (non renal): NONE SEEN /hpf (ref 0–10)
Renal Epithel, UA: NONE SEEN /hpf
WBC, UA: NONE SEEN /hpf (ref 0–5)

## 2020-09-19 NOTE — Progress Notes (Signed)
Urological Symptom Review  Patient is experiencing the following symptoms: Frequent urination Burning/pain with urination Urinary tract infection Sexually transmitted disease Erection problems (male only)   Review of Systems  Gastrointestinal (upper)  : Nausea  Gastrointestinal (lower) : Diarrhea  Constitutional : Negative for symptoms  Skin: Itching  Eyes: Negative for eye symptoms  Ear/Nose/Throat : Negative for Ear/Nose/Throat symptoms  Hematologic/Lymphatic: Negative for Hematologic/Lymphatic symptoms  Cardiovascular : Negative for cardiovascular symptoms  Respiratory : Negative for respiratory symptoms  Endocrine: Negative for endocrine symptoms  Musculoskeletal: Back pain  Neurological: Negative for neurological symptoms  Psychologic: Negative for psychiatric symptoms

## 2020-09-19 NOTE — Addendum Note (Signed)
Addended by: Ferdinand Lango on: 09/19/2020 04:27 PM   Modules accepted: Orders

## 2020-09-20 ENCOUNTER — Ambulatory Visit (HOSPITAL_COMMUNITY)
Admission: RE | Admit: 2020-09-20 | Discharge: 2020-09-20 | Disposition: A | Discharge status: Home / Self Care | Payer: No Typology Code available for payment source | Source: Ambulatory Visit | Attending: Urology | Admitting: Urology

## 2020-09-20 DIAGNOSIS — R109 Unspecified abdominal pain: Secondary | ICD-10-CM | POA: Insufficient documentation

## 2020-09-22 LAB — SPECIMEN STATUS REPORT

## 2020-09-22 NOTE — Progress Notes (Signed)
Results sent via my chart 

## 2020-09-23 LAB — CT NG M GENITALIUM NAA, URINE

## 2020-09-25 ENCOUNTER — Other Ambulatory Visit: Payer: Self-pay | Admitting: Family Medicine

## 2020-09-25 DIAGNOSIS — I10 Essential (primary) hypertension: Secondary | ICD-10-CM

## 2020-09-26 LAB — SPECIMEN STATUS REPORT

## 2020-09-26 LAB — GC/CHLAMYDIA PROBE AMP

## 2020-10-26 ENCOUNTER — Other Ambulatory Visit: Payer: Self-pay | Admitting: Family Medicine

## 2020-10-26 DIAGNOSIS — N529 Male erectile dysfunction, unspecified: Secondary | ICD-10-CM

## 2020-12-11 ENCOUNTER — Encounter: Payer: PRIVATE HEALTH INSURANCE | Admitting: Family Medicine

## 2020-12-18 ENCOUNTER — Ambulatory Visit (INDEPENDENT_AMBULATORY_CARE_PROVIDER_SITE_OTHER): Payer: No Typology Code available for payment source | Admitting: Family Medicine

## 2020-12-18 ENCOUNTER — Encounter: Payer: Self-pay | Admitting: Family Medicine

## 2020-12-18 VITALS — BP 117/86 | HR 89 | Ht 69.0 in | Wt 210.0 lb

## 2020-12-18 DIAGNOSIS — Z0001 Encounter for general adult medical examination with abnormal findings: Secondary | ICD-10-CM | POA: Diagnosis not present

## 2020-12-18 DIAGNOSIS — K219 Gastro-esophageal reflux disease without esophagitis: Secondary | ICD-10-CM | POA: Diagnosis not present

## 2020-12-18 DIAGNOSIS — E782 Mixed hyperlipidemia: Secondary | ICD-10-CM

## 2020-12-18 DIAGNOSIS — Z1211 Encounter for screening for malignant neoplasm of colon: Secondary | ICD-10-CM | POA: Diagnosis not present

## 2020-12-18 DIAGNOSIS — Z125 Encounter for screening for malignant neoplasm of prostate: Secondary | ICD-10-CM

## 2020-12-18 DIAGNOSIS — R7303 Prediabetes: Secondary | ICD-10-CM

## 2020-12-18 DIAGNOSIS — Z23 Encounter for immunization: Secondary | ICD-10-CM

## 2020-12-18 DIAGNOSIS — K582 Mixed irritable bowel syndrome: Secondary | ICD-10-CM

## 2020-12-18 DIAGNOSIS — J301 Allergic rhinitis due to pollen: Secondary | ICD-10-CM

## 2020-12-18 DIAGNOSIS — I10 Essential (primary) hypertension: Secondary | ICD-10-CM

## 2020-12-18 DIAGNOSIS — Z0000 Encounter for general adult medical examination without abnormal findings: Secondary | ICD-10-CM

## 2020-12-18 LAB — BAYER DCA HB A1C WAIVED: HB A1C (BAYER DCA - WAIVED): 6 % — ABNORMAL HIGH (ref 4.8–5.6)

## 2020-12-18 MED ORDER — PRAVASTATIN SODIUM 40 MG PO TABS: 40.0000 mg | ORAL_TABLET | Freq: Every day | ORAL | 3 refills | Status: DC

## 2020-12-18 MED ORDER — CETIRIZINE HCL 10 MG PO TABS: 10.0000 mg | ORAL_TABLET | Freq: Every day | ORAL | 3 refills | Status: DC

## 2020-12-18 MED ORDER — VALACYCLOVIR HCL 1 G PO TABS: 1000.0000 mg | ORAL_TABLET | Freq: Every day | ORAL | 5 refills | Status: DC

## 2020-12-18 MED ORDER — DICYCLOMINE HCL 10 MG PO CAPS: 10.0000 mg | ORAL_CAPSULE | Freq: Three times a day (TID) | ORAL | 1 refills | Status: DC

## 2020-12-18 MED ORDER — OMEPRAZOLE 40 MG PO CPDR
40.0000 mg | DELAYED_RELEASE_CAPSULE | Freq: Every day | ORAL | 3 refills | Status: DC
Start: 2020-12-18 — End: 2021-12-21

## 2020-12-18 NOTE — Progress Notes (Signed)
BP 117/86   Pulse 89   Ht 5' 9"  (1.753 m)   Wt 210 lb (95.3 kg)   SpO2 95%   BMI 31.01 kg/m    Subjective:   Patient ID: James Snyder, male    DOB: 03/27/1975, 45 y.o.   MRN: [id number]  HPI: James Snyder is a 45 y.o. male presenting on 12/18/2020 for Medical Management of Chronic Issues (CPE)   HPI Adult well exam Patient denies any chest pain, shortness of breath, headaches or vision issues, abdominal complaints, diarrhea, nausea, vomiting, or joint issues.  Patient has some intermittent nausea and indigestion and diarrhea.  He says every time he eats he will feel like he has a loose stool and this has been going off and on over the past few months or maybe longer.  He thinks he was treated for this a long time ago with some medicine that he took 3 times a day for a month and it got better.  Hypertension Patient is currently on enalapril, and their blood pressure today is 117/86. Patient denies any lightheadedness or dizziness. Patient denies headaches, blurred vision, chest pains, shortness of breath, or weakness. Denies any side effects from medication and is content with current medication.   GERD Patient is currently on omeprazole.  She denies any major symptoms or abdominal pain or belching or burping. She denies any blood in her stool or lightheadedness or dizziness.   Hyperlipidemia Patient is coming in for recheck of his hyperlipidemia. The patient is currently taking pravastatin. They deny any issues with myalgias or history of liver damage from it. They deny any focal numbness or weakness or chest pain.   Relevant past medical, surgical, family and social history reviewed and updated as indicated. Interim medical history since our last visit reviewed. Allergies and medications reviewed and updated.  Review of Systems  Constitutional:  Negative for chills and fever.  Eyes:  Negative for visual disturbance.  Respiratory:  Negative for shortness of breath and  wheezing.   Cardiovascular:  Negative for chest pain and leg swelling.  Musculoskeletal:  Negative for back pain and gait problem.  Skin:  Negative for rash.  Neurological:  Negative for dizziness, weakness and numbness.  All other systems reviewed and are negative.  Per HPI unless specifically indicated above   Allergies as of 12/18/2020       Reactions   Penicillins Hives   Did it involve swelling of the face/tongue/throat, SOB, or low BP? No Did it involve sudden or severe rash/hives, skin peeling, or any reaction on the inside of your mouth or nose? No Did you need to seek medical attention at a hospital or doctor's office? No When did it last happen?      Childhood If all above answers are "NO", may proceed with cephalosporin use.        Medication List        Accurate as of December 18, 2020 10:50 AM. If you have any questions, ask your nurse or doctor.          baclofen 10 MG tablet Commonly known as: LIORESAL Take 10 mg by mouth as needed for muscle spasms.   cetirizine 10 MG tablet Commonly known as: ZYRTEC Take 1 tablet (10 mg total) by mouth daily.   dicyclomine 10 MG capsule Commonly known as: BENTYL Take 1 capsule (10 mg total) by mouth 4 (four) times daily -  before meals and at bedtime. Started by: Worthy Rancher, MD  enalapril 2.5 MG tablet Commonly known as: VASOTEC TAKE 1 TABLET BY MOUTH EVERY DAY   fluticasone 50 MCG/ACT nasal spray Commonly known as: FLONASE Place 1-2 sprays into both nostrils as needed for allergies or rhinitis.   omeprazole 40 MG capsule Commonly known as: PRILOSEC Take 1 capsule (40 mg total) by mouth daily.   pravastatin 40 MG tablet Commonly known as: PRAVACHOL Take 1 tablet (40 mg total) by mouth daily.   sildenafil 100 MG tablet Commonly known as: VIAGRA TAKE 1 TABLET BY MOUTH DAILY AS NEEDED   valACYclovir 1000 MG tablet Commonly known as: VALTREX Take 1 tablet (1,000 mg total) by mouth daily.          Objective:   BP 117/86   Pulse 89   Ht 5' 9"  (1.753 m)   Wt 210 lb (95.3 kg)   SpO2 95%   BMI 31.01 kg/m   Wt Readings from Last 3 Encounters:  12/18/20 210 lb (95.3 kg)  12/24/19 193 lb (87.5 kg)  04/16/19 203 lb 12.8 oz (92.4 kg)    Physical Exam Vitals and nursing note reviewed.  Constitutional:      General: He is not in acute distress.    Appearance: He is well-developed. He is not diaphoretic.  Eyes:     General: No scleral icterus.    Conjunctiva/sclera: Conjunctivae normal.  Neck:     Thyroid: No thyromegaly.  Cardiovascular:     Rate and Rhythm: Normal rate and regular rhythm.     Heart sounds: Normal heart sounds. No murmur heard. Pulmonary:     Effort: Pulmonary effort is normal. No respiratory distress.     Breath sounds: Normal breath sounds. No wheezing.  Musculoskeletal:        General: No swelling. Normal range of motion.     Cervical back: Neck supple.  Lymphadenopathy:     Cervical: No cervical adenopathy.  Skin:    General: Skin is warm and dry.     Findings: No rash.  Neurological:     Mental Status: He is alert and oriented to person, place, and time.     Coordination: Coordination normal.  Psychiatric:        Behavior: Behavior normal.      Assessment & Plan:   Problem List Items Addressed This Visit       Cardiovascular and Mediastinum   Essential hypertension   Relevant Medications   pravastatin (PRAVACHOL) 40 MG tablet     Respiratory   Seasonal allergic rhinitis due to pollen   Relevant Medications   cetirizine (ZYRTEC) 10 MG tablet     Digestive   Acid reflux   Relevant Medications   omeprazole (PRILOSEC) 40 MG capsule   dicyclomine (BENTYL) 10 MG capsule     Other   Pre-diabetes   Relevant Orders   Bayer DCA Hb A1c Waived   Mixed hyperlipidemia   Relevant Medications   pravastatin (PRAVACHOL) 40 MG tablet   Other Visit Diagnoses     Well adult exam    -  Primary   Relevant Orders   CMP14+EGFR   CBC  with Differential/Platelet   Lipid panel   Need for immunization against influenza       Relevant Orders   Flu Vaccine QUAD 18moIM (Fluarix, Fluzone & Alfiuria Quad PF) (Completed)   Colon cancer screening       Relevant Orders   Ambulatory referral to Gastroenterology   Irritable bowel syndrome with both constipation and diarrhea  Relevant Medications   omeprazole (PRILOSEC) 40 MG capsule   dicyclomine (BENTYL) 10 MG capsule   Prostate cancer screening       Relevant Orders   PSA, total and free       Continue current medicine, will add dicyclomine to see if it helps with diarrhea and nausea, he is going to see gastroenterologist anyways to see if that helps with that.  Blood pressure looks good today. Follow up plan: Return in about 1 year (around 12/18/2021), or if symptoms worsen or fail to improve, for well adult exam.  Counseling provided for all of the vaccine components Orders Placed This Encounter  Procedures   Flu Vaccine QUAD 90moIM (Fluarix, Fluzone & Alfiuria Quad PF)   CMP14+EGFR   CBC with Differential/Platelet   Lipid panel   Bayer DCA Hb A1c Waived   PSA, total and free   Ambulatory referral to Gastroenterology    JCaryl Pina MD [facility] 12/18/2020, 10:50 AM

## 2020-12-19 LAB — CMP14+EGFR
ALT: 26 IU/L (ref 0–44)
AST: 19 IU/L (ref 0–40)
Albumin/Globulin Ratio: 1.8 (ref 1.2–2.2)
Albumin: 4.4 g/dL (ref 4.0–5.0)
Alkaline Phosphatase: 64 IU/L (ref 44–121)
BUN/Creatinine Ratio: 8 — ABNORMAL LOW (ref 9–20)
BUN: 7 mg/dL (ref 6–24)
Bilirubin Total: 0.5 mg/dL (ref 0.0–1.2)
CO2: 22 mmol/L (ref 20–29)
Calcium: 9.1 mg/dL (ref 8.7–10.2)
Chloride: 108 mmol/L — ABNORMAL HIGH (ref 96–106)
Creatinine, Ser: 0.85 mg/dL (ref 0.76–1.27)
Globulin, Total: 2.4 g/dL (ref 1.5–4.5)
Glucose: 94 mg/dL (ref 70–99)
Potassium: 4.4 mmol/L (ref 3.5–5.2)
Sodium: 144 mmol/L (ref 134–144)
Total Protein: 6.8 g/dL (ref 6.0–8.5)
eGFR: 109 mL/min/{1.73_m2} (ref >59)

## 2020-12-19 LAB — PSA, TOTAL AND FREE
PSA, Free Pct: 26.7 %
PSA, Free: 0.24 ng/mL
Prostate Specific Ag, Serum: 0.9 ng/mL (ref 0.0–4.0)

## 2020-12-19 LAB — CBC WITH DIFFERENTIAL/PLATELET
Basophils Absolute: 0 10*3/uL (ref 0.0–0.2)
Basos: 0 %
EOS (ABSOLUTE): 0.1 10*3/uL (ref 0.0–0.4)
Eos: 1 %
Hematocrit: 43.6 % (ref 37.5–51.0)
Hemoglobin: 14.6 g/dL (ref 13.0–17.7)
Immature Grans (Abs): 0 10*3/uL (ref 0.0–0.1)
Immature Granulocytes: 0 %
Lymphocytes Absolute: 2.7 10*3/uL (ref 0.7–3.1)
Lymphs: 37 %
MCH: 28.6 pg (ref 26.6–33.0)
MCHC: 33.5 g/dL (ref 31.5–35.7)
MCV: 86 fL (ref 79–97)
Monocytes Absolute: 0.8 10*3/uL (ref 0.1–0.9)
Monocytes: 11 %
Neutrophils Absolute: 3.8 10*3/uL (ref 1.4–7.0)
Neutrophils: 51 %
Platelets: 286 10*3/uL (ref 150–450)
RBC: 5.1 x10E6/uL (ref 4.14–5.80)
RDW: 13.5 % (ref 11.6–15.4)
WBC: 7.4 10*3/uL (ref 3.4–10.8)

## 2020-12-19 LAB — LIPID PANEL
Chol/HDL Ratio: 5.3 ratio — ABNORMAL HIGH (ref 0.0–5.0)
Cholesterol, Total: 210 mg/dL — ABNORMAL HIGH (ref 100–199)
HDL: 40 mg/dL (ref >39)
LDL Chol Calc (NIH): 136 mg/dL — ABNORMAL HIGH (ref 0–99)
Triglycerides: 190 mg/dL — ABNORMAL HIGH (ref 0–149)
VLDL Cholesterol Cal: 34 mg/dL (ref 5–40)

## 2020-12-20 ENCOUNTER — Encounter: Payer: Self-pay | Admitting: Internal Medicine

## 2021-03-06 ENCOUNTER — Other Ambulatory Visit: Payer: Self-pay | Admitting: Family Medicine

## 2021-03-06 DIAGNOSIS — I10 Essential (primary) hypertension: Secondary | ICD-10-CM

## 2021-03-14 ENCOUNTER — Other Ambulatory Visit: Payer: Self-pay

## 2021-03-14 ENCOUNTER — Ambulatory Visit
Admission: EM | Admit: 2021-03-14 | Discharge: 2021-03-14 | Disposition: A | Discharge status: Home / Self Care | Payer: PRIVATE HEALTH INSURANCE | Attending: Family Medicine | Admitting: Family Medicine

## 2021-03-14 DIAGNOSIS — R52 Pain, unspecified: Secondary | ICD-10-CM | POA: Diagnosis not present

## 2021-03-14 DIAGNOSIS — Z20828 Contact with and (suspected) exposure to other viral communicable diseases: Secondary | ICD-10-CM | POA: Diagnosis not present

## 2021-03-14 DIAGNOSIS — J069 Acute upper respiratory infection, unspecified: Secondary | ICD-10-CM

## 2021-03-14 DIAGNOSIS — R109 Unspecified abdominal pain: Secondary | ICD-10-CM | POA: Diagnosis not present

## 2021-03-14 LAB — POCT URINALYSIS DIP (MANUAL ENTRY)
Bilirubin, UA: NEGATIVE
Glucose, UA: NEGATIVE mg/dL
Ketones, POC UA: NEGATIVE mg/dL
Leukocytes, UA: NEGATIVE
Nitrite, UA: NEGATIVE
Protein Ur, POC: NEGATIVE mg/dL
Spec Grav, UA: 1.025 (ref 1.010–1.025)
Urobilinogen, UA: 0.2 E.U./dL
pH, UA: 5.5 (ref 5.0–8.0)

## 2021-03-14 MED ORDER — PROMETHAZINE-DM 6.25-15 MG/5ML PO SYRP: 5.0000 mL | ORAL_SOLUTION | Freq: Four times a day (QID) | ORAL | 0 refills | Status: DC | PRN

## 2021-03-14 MED ORDER — FLUTICASONE PROPIONATE 50 MCG/ACT NA SUSP: 1.0000 | Freq: Two times a day (BID) | NASAL | 0 refills | Status: DC

## 2021-03-14 NOTE — ED Provider Notes (Signed)
?[facility] ? ? ? ?CSN: [id number] ?Arrival date & time: 03/14/21  0846 ? ? ?  ? ?History   ?Chief Complaint ?Chief Complaint  ?Patient presents with  ? Sore Throat  ?  Sore throat, sinus issues and body aches   ? ? ?HPI ?James Snyder is a 46 y.o. male.  ? ?Presenting today with 2-day history of sore, swollen throat, nasal congestion, cough, generalized body aches and constant left flank pain.  When asked, he does endorse some mild dysuria additionally.  Denies fever, chest pain, shortness of breath, abdominal pain, nausea vomiting or diarrhea.  So far trying herbal teas and remedies with minimal relief of symptoms.  History of seasonal allergies not currently on medication for this, no history of pulmonary disease.  Multiple sick contacts at work recently. ? ? ?Past Medical History:  ?Diagnosis Date  ? Allergy   ? Erectile dysfunction   ? GERD (gastroesophageal reflux disease)   ? Hyperlipidemia   ? Hypertension   ? SVT (supraventricular tachycardia) ([place])   ? ? ?Patient Active Problem List  ? Diagnosis Date Noted  ? Mixed hyperlipidemia 01/01/2019  ? BMI 32.0-32.9,adult 01/01/2019  ? Seasonal allergic rhinitis due to pollen 01/01/2019  ? Pre-diabetes 02/29/2016  ? BMI 29.0-29.9,adult 12/24/2014  ? Erectile dysfunction 12/21/2014  ? H/O hyperlipidemia 04/28/2014  ? Essential hypertension 04/28/2014  ? Acid reflux 04/28/2014  ? Seasonal allergies 04/28/2014  ? ? ?Past Surgical History:  ?Procedure Laterality Date  ? NO PAST SURGERIES    ? SVT ABLATION N/A 11/18/2018  ? Procedure: SVT ABLATION;  Surgeon: Thompson Grayer, MD;  Location: [facility];  Service: Cardiovascular;  Laterality: N/A;  ? ? ? ? ? ?Home Medications   ? ?Prior to Admission medications   ?Medication Sig Start Date End Date Taking? Authorizing Provider  ?fluticasone (FLONASE) 50 MCG/ACT nasal spray Place 1 spray into both nostrils 2 (two) times daily. 03/14/21  Yes Volney American, PA-C  ?promethazine-dextromethorphan  (PROMETHAZINE-DM) 6.25-15 MG/5ML syrup Take 5 mLs by mouth 4 (four) times daily as needed. 03/14/21  Yes Volney American, PA-C  ?baclofen (LIORESAL) 10 MG tablet Take 10 mg by mouth as needed for muscle spasms.    [provider]  ?cetirizine (ZYRTEC) 10 MG tablet Take 1 tablet (10 mg total) by mouth daily. 12/18/20   Dettinger, Fransisca Kaufmann, MD  ?dicyclomine (BENTYL) 10 MG capsule Take 1 capsule (10 mg total) by mouth 4 (four) times daily -  before meals and at bedtime. 12/18/20   Dettinger, Fransisca Kaufmann, MD  ?enalapril (VASOTEC) 2.5 MG tablet TAKE 1 TABLET BY MOUTH EVERY DAY 03/06/21   Dettinger, Fransisca Kaufmann, MD  ?fluticasone (FLONASE) 50 MCG/ACT nasal spray Place 1-2 sprays into both nostrils as needed for allergies or rhinitis.    [provider]  ?omeprazole (PRILOSEC) 40 MG capsule Take 1 capsule (40 mg total) by mouth daily. 12/18/20   Dettinger, Fransisca Kaufmann, MD  ?pravastatin (PRAVACHOL) 40 MG tablet Take 1 tablet (40 mg total) by mouth daily. 12/18/20   Dettinger, Fransisca Kaufmann, MD  ?sildenafil (VIAGRA) 100 MG tablet TAKE 1 TABLET BY MOUTH DAILY AS NEEDED 10/27/20   Hendricks Limes F, FNP  ?valACYclovir (VALTREX) 1000 MG tablet Take 1 tablet (1,000 mg total) by mouth daily. 12/18/20   Dettinger, Fransisca Kaufmann, MD  ? ? ?Family History ?Family History  ?Problem Relation Age of Onset  ? Cancer Mother   ? Heart disease Father   ? Stroke Brother   ? ? ?  Social History ?Social History  ? ?Tobacco Use  ? Smoking status: Former  ?  Types: Cigars  ? Smokeless tobacco: Former  ? Tobacco comments:  ?  every once in a while  ?Vaping Use  ? Vaping Use: Never used  ?Substance Use Topics  ? Alcohol use: No  ?  Alcohol/week: 0.0 standard drinks  ? Drug use: Never  ? ? ? ?Allergies   ?Penicillins ? ? ?Review of Systems ?Review of Systems ?Per HPI ? ?Physical Exam ?Triage Vital Signs ?ED Triage Vitals  ?Enc Vitals Group  ?   BP 03/14/21 1004 (!) 141/95  ?   Pulse Rate 03/14/21 1004 95  ?   Resp 03/14/21 1004 18  ?   Temp 03/14/21 1004  98.4 ?F (36.9 ?C)  ?   Temp Source 03/14/21 1004 Oral  ?   SpO2 03/14/21 1004 96 %  ?   Weight --   ?   Height --   ?   Head Circumference --   ?   Peak Flow --   ?   Pain Score 03/14/21 1008 7  ?   Pain Loc --   ?   Pain Edu? --   ?   Excl. in [place]? --   ? ?No data found. ? ?Updated Vital Signs ?BP (!) 141/95 (BP Location: Right Arm)   Pulse 95   Temp 98.4 ?F (36.9 ?C) (Oral)   Resp 18   SpO2 96%  ? ?Visual Acuity ?Right Eye Distance:   ?Left Eye Distance:   ?Bilateral Distance:   ? ?Right Eye Near:   ?Left Eye Near:    ?Bilateral Near:    ? ?Physical Exam ?Vitals and nursing note reviewed.  ?Constitutional:   ?   Appearance: He is well-developed.  ?HENT:  ?   Head: Atraumatic.  ?   Right Ear: External ear normal.  ?   Left Ear: External ear normal.  ?   Nose: Rhinorrhea present.  ?   Mouth/Throat:  ?   Pharynx: Posterior oropharyngeal erythema present. No oropharyngeal exudate.  ?Eyes:  ?   Conjunctiva/sclera: Conjunctivae normal.  ?   Pupils: Pupils are equal, round, and reactive to light.  ?Cardiovascular:  ?   Rate and Rhythm: Normal rate and regular rhythm.  ?Pulmonary:  ?   Effort: Pulmonary effort is normal. No respiratory distress.  ?   Breath sounds: No wheezing or rales.  ?Abdominal:  ?   General: Bowel sounds are normal. There is no distension.  ?   Palpations: Abdomen is soft.  ?   Tenderness: There is no abdominal tenderness. There is no right CVA tenderness, left CVA tenderness or guarding.  ?Musculoskeletal:     ?   General: Normal range of motion.  ?   Cervical back: Normal range of motion and neck supple.  ?Lymphadenopathy:  ?   Cervical: No cervical adenopathy.  ?Skin: ?   General: Skin is warm and dry.  ?Neurological:  ?   Mental Status: He is alert and oriented to person, place, and time.  ?Psychiatric:     ?   Behavior: Behavior normal.  ? ? ? ?UC Treatments / Results  ?Labs ?(all labs ordered are listed, but only abnormal results are displayed) ?Labs Reviewed  ?POCT URINALYSIS DIP (MANUAL  ENTRY) - Abnormal; Notable for the following components:  ?    Result Value  ? Blood, UA trace-intact (*)   ? All other components within normal limits  ?COVID-19, FLU  A+B NAA  ? ? ?EKG ? ? ?Radiology ?No results found. ? ?Procedures ?Procedures (including critical care time) ? ?Medications Ordered in UC ?Medications - No data to display ? ?Initial Impression / Assessment and Plan / UC Course  ?I have reviewed the triage vital signs and the nursing notes. ? ?Pertinent labs & imaging results that were available during my care of the patient were reviewed by me and considered in my medical decision making (see chart for details). ? ?  ? ?Vital signs overall very reassuring today, suspect viral upper respiratory infection, possibly COVID.  Regarding his flank pain and dysuria, no evidence of urinary tract infection today but trace RBCs on UA.  Discussed with patient that this may be incidental or may be an early sign of passing a kidney stone or something similar.  Continue to drink lots of fluids and monitor, follow-up if symptoms worsening.  COVID and flu testing pending, treat with Flonase, supportive over-the-counter medications and home care while awaiting results.  Patient is open to an antiviral would be a good candidate for molnupiravir if COVID test positive. ? ?Final Clinical Impressions(s) / UC Diagnoses  ? ?Final diagnoses:  ?Exposure to the flu  ?Viral URI  ?Left flank pain  ?Generalized body aches  ? ? ? ?Discharge Instructions   ? ?  ?We will be in touch if your COVID and flu result comes back positive.  Continue over-the-counter supportive remedies, start Flonase nasal spray to help with your sinus pressure, nasal congestion and follow-up if your symptoms worsen. ? ? ? ?ED Prescriptions   ? ? Medication Sig Dispense Auth. Provider  ? fluticasone (FLONASE) 50 MCG/ACT nasal spray Place 1 spray into both nostrils 2 (two) times daily. [place], [place]  ? promethazine-dextromethorphan  (PROMETHAZINE-DM) 6.25-15 MG/5ML syrup Take 5 mLs by mouth 4 (four) times daily as needed. 100 mL Volney American, [place]  ? ?  ? ?PDMP not reviewed this encounter. ?  ?Volney American, PA-C ?03/14/21 1135 ?

## 2021-03-14 NOTE — Discharge Instructions (Signed)
We will be in touch if your COVID and flu result comes back positive.  Continue over-the-counter supportive remedies, start Flonase nasal spray to help with your sinus pressure, nasal congestion and follow-up if your symptoms worsen. ?

## 2021-03-14 NOTE — ED Triage Notes (Signed)
Pt states that 2 days ago his throat started hurting bad and he got really stopped up  ? ?Pt states that he is experiencing left lower back pain and body aches ? ?Denies Fever ? ?Denies Meds ? ? ?

## 2021-03-15 LAB — COVID-19, FLU A+B NAA
Influenza A, NAA: NOT DETECTED
Influenza B, NAA: NOT DETECTED
SARS-CoV-2, NAA: DETECTED — AB

## 2021-03-16 ENCOUNTER — Telehealth: Payer: Self-pay | Admitting: Urgent Care

## 2021-03-16 MED ORDER — MOLNUPIRAVIR EUA 200MG CAPSULE: 4.0000 | ORAL_CAPSULE | Freq: Two times a day (BID) | ORAL | 0 refills | Status: AC

## 2021-03-16 NOTE — Telephone Encounter (Signed)
Per PA Lane's documentation, patient is a good candidate for molnupiravir.  He is on day 4-5 of his symptoms and his COVID test was positive.  We will send a prescription for this to his pharmacy. ?

## 2021-03-16 NOTE — ED Notes (Signed)
Pt returned stating that covid result was positive and that PA reported would call in antiviral is positive. PA documentation confirmed pt statement. Consulted current UC PA and electronically sent over px to pt pharmacy. Pt aware and reported would pick up px this afternoon. ?

## 2021-04-07 ENCOUNTER — Other Ambulatory Visit: Payer: Self-pay | Admitting: Family Medicine

## 2021-04-08 ENCOUNTER — Other Ambulatory Visit: Payer: Self-pay | Admitting: Family Medicine

## 2021-04-08 DIAGNOSIS — N529 Male erectile dysfunction, unspecified: Secondary | ICD-10-CM

## 2021-04-13 ENCOUNTER — Telehealth: Payer: Self-pay | Admitting: Family Medicine

## 2021-04-13 NOTE — Telephone Encounter (Signed)
Pt would like this done as a PA - per [facility] he has to have it  ?

## 2021-04-13 NOTE — Telephone Encounter (Signed)
Patient was told that he needs a prior authorization for his sildenafil (VIAGRA) 100 MG tablet and he said that he has been getting this medication with the same dosage but never needed one before. Please call back and advise.  ?

## 2021-04-21 NOTE — Progress Notes (Signed)
? ?Referring Provider:  Dettinger, Elige RadonJoshua A, MD ?Primary Care Physician:  Dettinger, Elige RadonJoshua A, MD ?Primary Gastroenterologist:  Dr. Jena Gaussourk ? ?Chief Complaint  ?Patient presents with  ? Colonoscopy  ?  Diarrhea off and on. Stomach problems   ? ? ?HPI:   ?James Snyder is a 46 y.o. male presenting today at the request of  Dettinger, Elige RadonJoshua A, MD for consult colonoscopy.  Recommended office visit as patient was also having problems with nausea, indigestion, diarrhea. ? ?Reviewed office visit with PCP dated 12/18/2020.  Patient reported intermittent nausea and indigestion, on omeprazole 40 mg daily.  Also with loose stools after eating off-and-on for the last few months or possibly longer.  Thought he was treated a long time ago with something he took 3 times a day for a month and it got better.  He was continued on omeprazole 40 mg daily and started on dicyclomine.  Also referred to GI as he was due for colon cancer screening. ? ?He tested positive for COVID-19 on 03/14/2021. ? ?Today: ?No prior colonoscopy.  ? ?Reports he contracted trichomonas about 1-1/2 years ago and since that time, he has been having trouble with his bowels.  He was also going through a divorce. He has a lot of gassiness, stomach growling, and was having intermittent watery stools with up to 2 bowel movements per day.  States he would have a couple of "normal days", and then otherwise stools would be watery without identified trigger.  No associated abdominal pain with bowel movements though he does have intermittent left flank pain without identified trigger.  Pain is located along left lower ribs and does not radiate.  Not associated with meals, bowel movements, urination, or heavy lifting though he is very active.  Pain may occur 2 to 3 days a week and will resolve after taking a Goody powder. ? ?In December, he started dicyclomine and also changed his diet.  Now only eating fruits, vegetables, and fish.  Cut out steak and chicken.  He  continues to have 2 bowel movements daily, but stools are more formed or on the mushy side.  Mushy stools may occur 3 out of 7 days a week.  Again no dietary triggers.  Since starting dicyclomine, left sided pain hasn't changed.  ? ?Denies ever having nocturnal stools, BRBPR, or melena.  He is concerned about having parasites though denies ever seeing anything in his stools that look like worms. He does see little white dots at times. Drinks well water.  ? ?Weight has been stable.  ? ?No nausea or vomiting. GERD is well controlled. No dysphagia.  ? ?Past Medical History:  ?Diagnosis Date  ? Allergy   ? Erectile dysfunction   ? GERD (gastroesophageal reflux disease)   ? Hyperlipidemia   ? Hypertension   ? SVT (supraventricular tachycardia) (HCC)   ? ? ?Past Surgical History:  ?Procedure Laterality Date  ? SVT ABLATION N/A 11/18/2018  ? Procedure: SVT ABLATION;  Surgeon: Hillis RangeAllred, James, MD;  Location: [facility];  Service: Cardiovascular;  Laterality: N/A;  ? ? ?Current Outpatient Medications  ?Medication Sig Dispense Refill  ? baclofen (LIORESAL) 10 MG tablet Take 10 mg by mouth as needed for muscle spasms.    ? cetirizine (ZYRTEC) 10 MG tablet Take 1 tablet (10 mg total) by mouth daily. 90 tablet 3  ? dicyclomine (BENTYL) 10 MG capsule Take 1 capsule (10 mg total) by mouth 4 (four) times daily -  before meals and at bedtime.  90 capsule 1  ? enalapril (VASOTEC) 2.5 MG tablet TAKE 1 TABLET BY MOUTH EVERY DAY 90 tablet 0  ? fluticasone (FLONASE) 50 MCG/ACT nasal spray Place 1 spray into both nostrils 2 (two) times daily. 16 g 0  ? omeprazole (PRILOSEC) 40 MG capsule Take 1 capsule (40 mg total) by mouth daily. 90 capsule 3  ? pravastatin (PRAVACHOL) 40 MG tablet Take 1 tablet (40 mg total) by mouth daily. 90 tablet 3  ? promethazine-dextromethorphan (PROMETHAZINE-DM) 6.25-15 MG/5ML syrup Take 5 mLs by mouth 4 (four) times daily as needed. 100 mL 0  ? sildenafil (VIAGRA) 100 MG tablet TAKE 1 TABLET BY MOUTH EVERY  DAY AS NEEDED 6 tablet 1  ? valACYclovir (VALTREX) 1000 MG tablet Take 1 tablet (1,000 mg total) by mouth daily. 30 tablet 5  ? polyethylene glycol-electrolytes (TRILYTE) 420 g solution Take 4,000 mLs by mouth as directed. 4000 mL 0  ? ?No current facility-administered medications for this visit.  ? ? ?Allergies as of 04/23/2021 - Review Complete 04/23/2021  ?Allergen Reaction Noted  ? Penicillins Hives 11/17/2012  ? ? ?Family History  ?Problem Relation Age of Onset  ? Cancer Mother   ? Heart disease Father   ? Stroke Brother   ? Colon cancer Neg Hx   ? Inflammatory bowel disease Neg Hx   ? ? ?Social History  ? ?Socioeconomic History  ? Marital status: Single  ?  Spouse name: Not on file  ? Number of children: Not on file  ? Years of education: Not on file  ? Highest education level: Not on file  ?Occupational History  ? Not on file  ?Tobacco Use  ? Smoking status: Former  ?  Types: Cigars  ? Smokeless tobacco: Former  ? Tobacco comments:  ?  every once in a while  ?Vaping Use  ? Vaping Use: Never used  ?Substance and Sexual Activity  ? Alcohol use: No  ?  Alcohol/week: 0.0 standard drinks  ? Drug use: Never  ? Sexual activity: Yes  ?  Birth control/protection: None  ?Other Topics Concern  ? Not on file  ?Social History Narrative  ? Lives in [place] with wife and son.  ? Works as a Emergency planning/management officer in [place].  ? ?Social Determinants of Health  ? ?Financial Resource Strain: Not on file  ?Food Insecurity: Not on file  ?Transportation Needs: Not on file  ?Physical Activity: Not on file  ?Stress: Not on file  ?Social Connections: Not on file  ?Intimate Partner Violence: Not on file  ? ? ?Review of Systems: ?Gen: Denies any fever, chills, cold or flulike symptoms, presyncope, syncope.  ?CV: Denies chest pain, heart palpitations.  ?Resp: Denies shortness of breath or cough.  ?GI: See HPI ?GU : Denies urinary burning, urinary frequency, urinary hesitancy ?MS: Denies joint pain. ?Derm: Denies rash. ?Psych: Denies  depression, anxiety. ?Heme: See HPI. ? ?Physical Exam: ?BP 136/76   Pulse 90   Temp (!) 97.5 ?F (36.4 ?C) (Temporal)   Ht 5\' 9"  (1.753 m)   Wt 203 lb (92.1 kg)   BMI 29.98 kg/m?  ?General:   Alert and oriented. Pleasant and cooperative. Well-nourished and well-developed.  ?Head:  Normocephalic and atraumatic. ?Eyes:  Without icterus, sclera clear and conjunctiva pink.  ?Ears:  Normal auditory acuity. ?Lungs:  Clear to auscultation bilaterally. No wheezes, rales, or rhonchi. No distress.  ?Heart:  S1, S2 present without murmurs appreciated.  ?Abdomen:  +BS, soft, non-tender and non-distended. No HSM noted.  No guarding or rebound. No masses appreciated.  ?Rectal:  Deferred  ?Msk:  Symmetrical without gross deformities. Normal posture. ?Extremities:  Without edema. ?Neurologic:  Alert and  oriented x4;  grossly normal neurologically. ?Skin:  Intact without significant lesions or rashes. ?Psych:  Normal mood and affect. ? ? ? ?Assessment:  ?46 year old male with history of GERD, HTN, HLD, SVT s/p ablation, presenting today to discuss scheduling screening colonoscopy and to discuss change in bowel habits. ? ?Change in bowel habits/loose stools: ?New onset intermittent loose to watery stools after an episode of trichomonas 1.5 years ago and going through a divorce over the last year.  Associated increased gassiness and stomach growling, but no abdominal pain.  He does have intermittent left flank pain that is unassociated with bowel movements.  He eliminated steak, chicken, and started dicyclomine 3-4 times daily in December, now having 2 bowel movements per day that can be formed or mushy. Previously was having 1-2 watery stools 4-5 days a week.  Denies ever having nocturnal stools, BRBPR, melena, unintentional weight loss. Aside from being treated for trichomonas, no other antibiotics. No family history of colon cancer or IBD. He does drink well water. He is concerned about having a parasite.  Reviewed labs from  December 2022.  No anemia.  CMP essentially normal. ? ?Query whether symptoms are secondary to SIBO, IBS though doesn't have classic abdominal pain, celiac disease, thyroid abnormality.  Less likely parasitic infection. Sy

## 2021-04-23 ENCOUNTER — Encounter: Payer: Self-pay | Admitting: Gastroenterology

## 2021-04-23 ENCOUNTER — Ambulatory Visit (INDEPENDENT_AMBULATORY_CARE_PROVIDER_SITE_OTHER): Payer: PRIVATE HEALTH INSURANCE | Admitting: Gastroenterology

## 2021-04-23 ENCOUNTER — Other Ambulatory Visit: Payer: Self-pay

## 2021-04-23 ENCOUNTER — Telehealth: Payer: Self-pay

## 2021-04-23 VITALS — BP 136/76 | HR 90 | Temp 97.5°F | Ht 69.0 in | Wt 203.0 lb

## 2021-04-23 DIAGNOSIS — R109 Unspecified abdominal pain: Secondary | ICD-10-CM | POA: Diagnosis not present

## 2021-04-23 DIAGNOSIS — Z1211 Encounter for screening for malignant neoplasm of colon: Secondary | ICD-10-CM | POA: Diagnosis not present

## 2021-04-23 DIAGNOSIS — R194 Change in bowel habit: Secondary | ICD-10-CM | POA: Diagnosis not present

## 2021-04-23 DIAGNOSIS — R195 Other fecal abnormalities: Secondary | ICD-10-CM | POA: Insufficient documentation

## 2021-04-23 MED ORDER — PEG 3350-KCL-NA BICARB-NACL 420 G PO SOLR: 4000.0000 mL | ORAL | 0 refills | Status: DC

## 2021-04-23 NOTE — Telephone Encounter (Signed)
Called [facility], spoke to Teresa, no PA needed for TCS. Ref# [email or web address]. 

## 2021-04-23 NOTE — Patient Instructions (Addendum)
Please have labs and stool studies completed at [facility]. ? ?Continue dicyclomine 3-4 times daily.  Hold in the setting of constipation. ? ?We will arrange to have a colonoscopy in the near future with Dr. Jena Gauss. ?Do not take dicyclomine 2 days prior to starting your colon prep or while taking your colon prep.  ? ?We will follow-up with you in the office after your colonoscopy.  Do not hesitate to call if you have any questions or concerns prior to your next visit. ? ?It was nice meeting you today! ? ?Ermalinda Memos, PA-C ?[facility] ? ? ?

## 2021-04-24 NOTE — Telephone Encounter (Signed)
[facility] will not handle this type of review.  ? ?Pt does not have pulmonary HTN - will have to pay out of pocket for this med  ? ?No PA done / pt aware to ask for cash price for this med at the pharm from now on  ?

## 2021-04-25 LAB — TISSUE TRANSGLUTAMINASE, IGA: (tTG) Ab, IgA: <1 U/mL

## 2021-04-25 LAB — TSH: TSH: 1.19 mIU/L (ref 0.40–4.50)

## 2021-04-25 LAB — IGA: Immunoglobulin A: 120 mg/dL (ref 47–310)

## 2021-04-26 ENCOUNTER — Other Ambulatory Visit: Payer: Self-pay | Admitting: Family Medicine

## 2021-04-26 NOTE — Telephone Encounter (Signed)
Requested Prescriptions  ?Refused Prescriptions Disp Refills  ?? fluticasone (FLONASE) 50 MCG/ACT nasal spray [Pharmacy Med Name: FLUTICASONE PROP 50 MCG SPRAY] 16 mL   ?  Sig: PLACE 1 SPRAY INTO BOTH NOSTRILS 2 (TWO) TIMES DAILY  ?  ? There is no refill protocol information for this order  ?  ? ?

## 2021-05-02 ENCOUNTER — Other Ambulatory Visit: Payer: Self-pay | Admitting: Family Medicine

## 2021-05-02 DIAGNOSIS — K582 Mixed irritable bowel syndrome: Secondary | ICD-10-CM

## 2021-05-03 LAB — OVA AND PARASITE EXAMINATION
CONCENTRATE RESULT:: NONE SEEN
MICRO NUMBER:: 13273448
SPECIMEN QUALITY:: ADEQUATE
TRICHROME RESULT:: NONE SEEN

## 2021-05-30 ENCOUNTER — Ambulatory Visit (HOSPITAL_COMMUNITY): Payer: PRIVATE HEALTH INSURANCE | Admitting: Anesthesiology

## 2021-05-30 ENCOUNTER — Encounter (HOSPITAL_COMMUNITY): Payer: Self-pay | Admitting: Internal Medicine

## 2021-05-30 ENCOUNTER — Other Ambulatory Visit: Payer: Self-pay

## 2021-05-30 ENCOUNTER — Ambulatory Visit (HOSPITAL_BASED_OUTPATIENT_CLINIC_OR_DEPARTMENT_OTHER): Payer: PRIVATE HEALTH INSURANCE | Admitting: Anesthesiology

## 2021-05-30 ENCOUNTER — Encounter (HOSPITAL_COMMUNITY)
Admission: RE | Disposition: A | Discharge status: Home / Self Care | Payer: Self-pay | Source: Home / Self Care | Attending: Internal Medicine

## 2021-05-30 ENCOUNTER — Ambulatory Visit (HOSPITAL_COMMUNITY)
Admission: RE | Admit: 2021-05-30 | Discharge: 2021-05-30 | Disposition: A | Discharge status: Home / Self Care | Payer: PRIVATE HEALTH INSURANCE | Attending: Internal Medicine | Admitting: Internal Medicine

## 2021-05-30 DIAGNOSIS — J302 Other seasonal allergic rhinitis: Secondary | ICD-10-CM

## 2021-05-30 DIAGNOSIS — Z79899 Other long term (current) drug therapy: Secondary | ICD-10-CM | POA: Diagnosis not present

## 2021-05-30 DIAGNOSIS — J301 Allergic rhinitis due to pollen: Secondary | ICD-10-CM

## 2021-05-30 DIAGNOSIS — R7303 Prediabetes: Secondary | ICD-10-CM

## 2021-05-30 DIAGNOSIS — Z1211 Encounter for screening for malignant neoplasm of colon: Secondary | ICD-10-CM | POA: Diagnosis present

## 2021-05-30 DIAGNOSIS — K219 Gastro-esophageal reflux disease without esophagitis: Secondary | ICD-10-CM | POA: Diagnosis not present

## 2021-05-30 DIAGNOSIS — Z6832 Body mass index (BMI) 32.0-32.9, adult: Secondary | ICD-10-CM

## 2021-05-30 DIAGNOSIS — R195 Other fecal abnormalities: Secondary | ICD-10-CM

## 2021-05-30 DIAGNOSIS — R109 Unspecified abdominal pain: Secondary | ICD-10-CM

## 2021-05-30 DIAGNOSIS — Z87891 Personal history of nicotine dependence: Secondary | ICD-10-CM | POA: Insufficient documentation

## 2021-05-30 DIAGNOSIS — K635 Polyp of colon: Secondary | ICD-10-CM

## 2021-05-30 DIAGNOSIS — I10 Essential (primary) hypertension: Secondary | ICD-10-CM

## 2021-05-30 DIAGNOSIS — R194 Change in bowel habit: Secondary | ICD-10-CM

## 2021-05-30 DIAGNOSIS — E782 Mixed hyperlipidemia: Secondary | ICD-10-CM

## 2021-05-30 DIAGNOSIS — Z6829 Body mass index (BMI) 29.0-29.9, adult: Secondary | ICD-10-CM

## 2021-05-30 DIAGNOSIS — N529 Male erectile dysfunction, unspecified: Secondary | ICD-10-CM

## 2021-05-30 DIAGNOSIS — Z8639 Personal history of other endocrine, nutritional and metabolic disease: Secondary | ICD-10-CM

## 2021-05-30 HISTORY — PX: COLONOSCOPY WITH PROPOFOL: SHX5780

## 2021-05-30 HISTORY — PX: POLYPECTOMY: SHX5525

## 2021-05-30 SURGERY — COLONOSCOPY WITH PROPOFOL
Anesthesia: General

## 2021-05-30 MED ORDER — LACTATED RINGERS IV SOLN: INTRAVENOUS | Status: DC

## 2021-05-30 MED ORDER — PROPOFOL 500 MG/50ML IV EMUL
INTRAVENOUS | Status: DC | PRN
  Administered 2021-05-30: 175 ug/kg/min via INTRAVENOUS

## 2021-05-30 MED ORDER — PROPOFOL 10 MG/ML IV BOLUS
INTRAVENOUS | Status: DC | PRN
  Administered 2021-05-30: 100 mg via INTRAVENOUS
  Administered 2021-05-30: 50 mg via INTRAVENOUS

## 2021-05-30 NOTE — Anesthesia Postprocedure Evaluation (Signed)
Anesthesia Post Note ? ?Patient: James Snyder ? ?Procedure(s) Performed: COLONOSCOPY WITH PROPOFOL ?POLYPECTOMY ? ?Patient location during evaluation: Endoscopy ?Anesthesia Type: General ?Level of consciousness: awake and alert and oriented ?Pain management: pain level controlled ?Vital Signs Assessment: post-procedure vital signs reviewed and stable ?Respiratory status: spontaneous breathing, nonlabored ventilation and respiratory function stable ?Cardiovascular status: blood pressure returned to baseline and stable ?Postop Assessment: no apparent nausea or vomiting ?Anesthetic complications: no ? ? ?No notable events documented. ? ? ?Last Vitals:  ?Vitals:  ? 05/30/21 1216 05/30/21 1301  ?BP: (!) 162/108 118/84  ?Pulse: (!) 118 86  ?Resp: (!) 22 18  ?Temp:  36.8 ?C  ?SpO2: 97% 97%  ?  ?Last Pain:  ?Vitals:  ? 05/30/21 1301  ?TempSrc: Oral  ?PainSc: 0-No pain  ? ? ?  ?  ?  ?  ?  ?  ? ?Maliya Marich C [name] ? ? ? ? ?

## 2021-05-30 NOTE — Anesthesia Preprocedure Evaluation (Signed)
Anesthesia Evaluation  ?Patient identified by MRN, date of birth, ID band ?Patient awake ? ? ? ?Reviewed: ?Allergy & Precautions, NPO status , Patient's Chart, lab work & pertinent test results ? ?Airway ?Mallampati: II ? ?TM Distance: >3 FB ?Neck ROM: Full ? ? ? Dental ? ?(+) Dental Advisory Given, Teeth Intact, Caps ?  ?Pulmonary ?neg pulmonary ROS, former smoker,  ?  ?Pulmonary exam normal ?breath sounds clear to auscultation ? ? ? ? ? ? Cardiovascular ?Exercise Tolerance: Good ?hypertension, Pt. on medications ?Normal cardiovascular exam ?Rhythm:Regular Rate:Normal ? ? ?  ?Neuro/Psych ?negative neurological ROS ? negative psych ROS  ? GI/Hepatic ?Neg liver ROS, GERD  Medicated and Controlled,  ?Endo/Other  ?negative endocrine ROS ? Renal/GU ?negative Renal ROS  ?negative genitourinary ?  ?Musculoskeletal ?negative musculoskeletal ROS ?(+)  ? Abdominal ?  ?Peds ?negative pediatric ROS ?(+)  Hematology ?negative hematology ROS ?(+)   ?Anesthesia Other Findings ? ? Reproductive/Obstetrics ?negative OB ROS ? ?  ? ? ? ? ? ? ? ? ? ? ? ? ? ?  ?  ? ? ? ? ? ? ? ? ?Anesthesia Physical ?Anesthesia Plan ? ?ASA: 2 ? ?Anesthesia Plan: General  ? ?Post-op Pain Management: Minimal or no pain anticipated  ? ?Induction: Intravenous ? ?PONV Risk Score and Plan: Propofol infusion ? ?Airway Management Planned:  ? ?Additional Equipment:  ? ?Intra-op Plan:  ? ?Post-operative Plan:  ? ?Informed Consent: I have reviewed the patients History and Physical, chart, labs and discussed the procedure including the risks, benefits and alternatives for the proposed anesthesia with the patient or authorized representative who has indicated his/her understanding and acceptance.  ? ? ? ? ? ?Plan Discussed with: CRNA and Surgeon ? ?Anesthesia Plan Comments:   ? ? ? ? ? ? ?Anesthesia Quick Evaluation ? ?

## 2021-05-30 NOTE — Discharge Instructions (Signed)
?  Colonoscopy ?Discharge Instructions ? ?Read the instructions outlined below and refer to this sheet in the next few weeks. These discharge instructions provide you with general information on caring for yourself after you leave the hospital. Your doctor may also give you specific instructions. While your treatment has been planned according to the most current medical practices available, unavoidable complications occasionally occur. If you have any problems or questions after discharge, call Dr. Gala Romney at [phone]. ?ACTIVITY ?You may resume your regular activity, but move at a slower pace for the next 24 hours.  ?Take frequent rest periods for the next 24 hours.  ?Walking will help get rid of the air and reduce the bloated feeling in your belly (abdomen).  ?No driving for 24 hours (because of the medicine (anesthesia) used during the test).   ?Do not sign any important legal documents or operate any machinery for 24 hours (because of the anesthesia used during the test).  ?NUTRITION ?Drink plenty of fluids.  ?You may resume your normal diet as instructed by your doctor.  ?Begin with a light meal and progress to your normal diet. Heavy or fried foods are harder to digest and may make you feel sick to your stomach (nauseated).  ?Avoid alcoholic beverages for 24 hours or as instructed.  ?MEDICATIONS ?You may resume your normal medications unless your doctor tells you otherwise.  ?WHAT YOU CAN EXPECT TODAY ?Some feelings of bloating in the abdomen.  ?Passage of more gas than usual.  ?Spotting of blood in your stool or on the toilet paper.  ?IF YOU HAD POLYPS REMOVED DURING THE COLONOSCOPY: ?No aspirin products for 7 days or as instructed.  ?No alcohol for 7 days or as instructed.  ?Eat a soft diet for the next 24 hours.  ?FINDING OUT THE RESULTS OF YOUR TEST ?Not all test results are available during your visit. If your test results are not back during the visit, make an appointment with your caregiver to find out the  results. Do not assume everything is normal if you have not heard from your caregiver or the medical facility. It is important for you to follow up on all of your test results.  ?SEEK IMMEDIATE MEDICAL ATTENTION IF: ?You have more than a spotting of blood in your stool.  ?Your belly is swollen (abdominal distention).  ?You are nauseated or vomiting.  ?You have a temperature over 101.  ?You have abdominal pain or discomfort that is severe or gets worse throughout the day.   ? ? ?2 polyps removed from your colon today.  Stool sample collected for further studies ? ?Further recommendations to follow pending review of pathology report ? ?

## 2021-05-30 NOTE — Transfer of Care (Signed)
Immediate Anesthesia Transfer of Care Note ? ?Patient: James Snyder ? ?Procedure(s) Performed: COLONOSCOPY WITH PROPOFOL ?POLYPECTOMY ? ?Patient Location: PACU ? ?Anesthesia Type:General ? ?Level of Consciousness: awake, alert  and oriented ? ?Airway & Oxygen Therapy: Patient Spontanous Breathing ? ?Post-op Assessment: Report given to RN, Post -op Vital signs reviewed and stable, Patient moving all extremities X 4 and Patient able to stick tongue midline ? ?Post vital signs: Reviewed ? ?Last Vitals:  ?Vitals Value Taken Time  ?BP 118/84 05/30/21 1301  ?Temp 36.8 ?C 05/30/21 1301  ?Pulse 86 05/30/21 1301  ?Resp 18 05/30/21 1301  ?SpO2 97 % 05/30/21 1301  ? ? ?Last Pain:  ?Vitals:  ? 05/30/21 1301  ?TempSrc: Oral  ?PainSc: 0-No pain  ?   ? ?Patients Stated Pain Goal: 2 (05/30/21 1214) ? ?Complications: No notable events documented. ?

## 2021-05-30 NOTE — Op Note (Signed)
[facility] ?Patient Name: James ReamsBradley Mercier ?Procedure Date: 05/30/2021 11:42 AM ?MRN: [id number] ?Date of Birth: 08/19/1975 ?Attending MD: Gennette Pacobert Michael Jayline Kilburg , MD ?CSN: [id number] ?Age: 46 ?Admit Type: Outpatient ?Procedure:                Colonoscopy ?Indications:              Screening for colorectal malignant neoplasm ?Providers:                Gennette Pacobert Michael Lumina Gitto, MD, Jannett CelestineAnitra Bell, RN, Tammy  ?                          Vaught, RN ?Referring MD:              ?Medicines:                Propofol per Anesthesia ?Complications:            No immediate complications. ?Estimated Blood Loss:     Estimated blood loss was minimal. ?Procedure:                Pre-Anesthesia Assessment: ?                          - Prior to the procedure, a History and Physical  ?                          was performed, and patient medications and  ?                          allergies were reviewed. The patient's tolerance of  ?                          previous anesthesia was also reviewed. The risks  ?                          and benefits of the procedure and the sedation  ?                          options and risks were discussed with the patient.  ?                          All questions were answered, and informed consent  ?                          was obtained. Prior Anticoagulants: The patient has  ?                          taken no previous anticoagulant or antiplatelet  ?                          agents. ASA Grade Assessment: III - A patient with  ?                          severe systemic disease. After reviewing the risks  ?  and benefits, the patient was deemed in  ?                          satisfactory condition to undergo the procedure. ?                          After obtaining informed consent, the colonoscope  ?                          was passed under direct vision. Throughout the  ?                          procedure, the patient's blood pressure, pulse, and  ?                           oxygen saturations were monitored continuously. The  ?                          [phone]) scope was introduced through the  ?                          anus and advanced to the the cecum, identified by  ?                          appendiceal orifice and ileocecal valve. The  ?                          colonoscopy was performed without difficulty. The  ?                          patient tolerated the procedure well. The quality  ?                          of the bowel preparation was adequate. ?Scope In: 12:41:19 PM ?Scope Out: 12:56:11 PM ?Scope Withdrawal Time: 0 hours 8 minutes 54 seconds  ?Total Procedure Duration: 0 hours 14 minutes 52 seconds  ?Findings: ?     The perianal and digital rectal examinations were normal. ?     Two semi-pedunculated polyps were found in the sigmoid colon. The polyps  ?     were 4 to 5 mm in size. These polyps were removed with a cold snare.  ?     Resection and retrieval were complete. Estimated blood loss was minimal.  ?     Distal 10 cm of TI appeared normal. ?     The exam was otherwise without abnormality on direct and retroflexion  ?     views. Stool sample taken for GIP ?Impression:               - Two 4 to 5 mm polyps in the sigmoid colon,  ?                          removed with a cold snare. Resected and retrieved. ?                          - The  examination was otherwise normal on direct  ?                          and retroflexion views. Normal TI. Status post  ?                          stool sample collection ?Moderate Sedation: ?     Moderate (conscious) sedation was personally administered by an  ?     anesthesia professional. The following parameters were monitored: oxygen  ?     saturation, heart rate, blood pressure, and response to care. ?Recommendation:           - Patient has a contact number available for  ?                          emergencies. The signs and symptoms of potential  ?                          delayed complications were discussed with  the  ?                          patient. Return to normal activities tomorrow.  ?                          Written discharge instructions were provided to the  ?                          patient. ?                          - Resume previous diet. ?                          - Patient has a contact number available for  ?                          emergencies. The signs and symptoms of potential  ?                          delayed complications were discussed with the  ?                          patient. Return to normal activities tomorrow.  ?                          Written discharge instructions were provided to the  ?                          patient. Follow-up on pending studies. ?Procedure Code(s):        --- Professional --- ?                          [phone], Colonoscopy, flexible; with removal of  ?                          tumor(s), polyp(s), or  other lesion(s) by snare  ?                          technique ?Diagnosis Code(s):        --- Professional --- ?                          Z12.11, Encounter for screening for malignant  ?                          neoplasm of colon ?                          K63.5, Polyp of colon ?CPT copyright 2019 [employer]. All rights reserved. ?The codes documented in this report are preliminary and upon coder review may  ?be revised to meet current compliance requirements. ?Gerrit Friends. Jozelyn Kuwahara, MD ?Gennette Pac, MD ?05/30/2021 1:05:26 PM ?This report has been signed electronically. ?Number of Addenda: 0 ?

## 2021-05-30 NOTE — H&P (Signed)
@LOGO @ ? ? ?Primary Care Physician:  Dettinger, Fransisca Kaufmann, MD ?Primary Gastroenterologist:  Dr. Gala Romney ? ?Pre-Procedure History & Physical: ?HPI:  James Snyder is a 46 y.o. male here for further evaluation of altered bowel habits intermittent diarrhea.  First-ever screening colonoscopy.  Has had intermittent bloating and diarrhea for 2 years.  Nonbloody stool. ?Exposed to well water at home tries not to drink it.  O&P negative.  Celiac screen negative ? ?Past Medical History:  ?Diagnosis Date  ? Allergy   ? Erectile dysfunction   ? GERD (gastroesophageal reflux disease)   ? Hyperlipidemia   ? Hypertension   ? SVT (supraventricular tachycardia) ([place])   ? ? ?Past Surgical History:  ?Procedure Laterality Date  ? SVT ABLATION N/A 11/18/2018  ? Procedure: SVT ABLATION;  Surgeon: Thompson Grayer, MD;  Location: [facility];  Service: Cardiovascular;  Laterality: N/A;  ? ? ?Prior to Admission medications   ?Medication Sig Start Date End Date Taking? Authorizing Provider  ?Aspirin-Acetaminophen-Caffeine (GOODY HEADACHE PO) Take 1 packet by mouth daily as needed (pain).   Yes [provider]  ?cetirizine (ZYRTEC) 10 MG tablet Take 1 tablet (10 mg total) by mouth daily. 12/18/20  Yes Dettinger, Fransisca Kaufmann, MD  ?enalapril (VASOTEC) 2.5 MG tablet TAKE 1 TABLET BY MOUTH EVERY DAY 03/06/21  Yes Dettinger, Fransisca Kaufmann, MD  ?fluticasone (FLONASE) 50 MCG/ACT nasal spray Place 1 spray into both nostrils 2 (two) times daily. ?Patient taking differently: Place 1 spray into both nostrils 2 (two) times daily as needed for allergies. 03/14/21  Yes Volney American, PA-C  ?omeprazole (PRILOSEC) 40 MG capsule Take 1 capsule (40 mg total) by mouth daily. 12/18/20  Yes Dettinger, Fransisca Kaufmann, MD  ?polyethylene glycol-electrolytes (TRILYTE) 420 g solution Take 4,000 mLs by mouth as directed. 04/23/21  Yes Jasa Dundon, Cristopher Estimable, MD  ?pravastatin (PRAVACHOL) 40 MG tablet Take 1 tablet (40 mg total) by mouth daily. 12/18/20  Yes Dettinger, Fransisca Kaufmann, MD  ?sildenafil (VIAGRA) 100 MG tablet TAKE 1 TABLET BY MOUTH EVERY DAY AS NEEDED 04/09/21  Yes Dettinger, Fransisca Kaufmann, MD  ?valACYclovir (VALTREX) 1000 MG tablet Take 1 tablet (1,000 mg total) by mouth daily. 12/18/20  Yes Dettinger, Fransisca Kaufmann, MD  ?baclofen (LIORESAL) 10 MG tablet Take 10 mg by mouth as needed for muscle spasms.    [provider]  ?dicyclomine (BENTYL) 10 MG capsule TAKE 1 CAPSULE (10 MG TOTAL) BY MOUTH 4 TIMES A DAY BEFORE MEALS AND AT BEDTIME ?Patient not taking: Reported on 05/29/2021 05/02/21   Dettinger, Fransisca Kaufmann, MD  ?promethazine-dextromethorphan (PROMETHAZINE-DM) 6.25-15 MG/5ML syrup Take 5 mLs by mouth 4 (four) times daily as needed. ?Patient not taking: Reported on 05/29/2021 03/14/21   Volney American, PA-C  ? ? ?Allergies as of 04/23/2021 - Review Complete 04/23/2021  ?Allergen Reaction Noted  ? Penicillins Hives 11/17/2012  ? ? ?Family History  ?Problem Relation Age of Onset  ? Cancer Mother   ? Heart disease Father   ? Stroke Brother   ? Colon cancer Neg Hx   ? Inflammatory bowel disease Neg Hx   ? ? ?Social History  ? ?Socioeconomic History  ? Marital status: Single  ?  Spouse name: Not on file  ? Number of children: Not on file  ? Years of education: Not on file  ? Highest education level: Not on file  ?Occupational History  ? Not on file  ?Tobacco Use  ? Smoking status: Former  ?  Types: Cigars  ?  Smokeless tobacco: Former  ? Tobacco comments:  ?  every once in a while  ?Vaping Use  ? Vaping Use: Never used  ?Substance and Sexual Activity  ? Alcohol use: No  ?  Alcohol/week: 0.0 standard drinks  ? Drug use: Never  ? Sexual activity: Yes  ?  Birth control/protection: None  ?Other Topics Concern  ? Not on file  ?Social History Narrative  ? Lives in [place] with wife and son.  ? Works as a Engineer, structural in [place].  ? ?Social Determinants of Health  ? ?Financial Resource Strain: Not on file  ?Food Insecurity: Not on file  ?Transportation Needs: Not on file  ?Physical  Activity: Not on file  ?Stress: Not on file  ?Social Connections: Not on file  ?Intimate Partner Violence: Not on file  ? ? ?Review of Systems: ?See HPI, otherwise negative ROS ? ?Physical Exam: ?Temp 99 ?F (37.2 ?C) (Oral)   Ht 5\' 9"  (1.753 m)   Wt 90.7 kg   BMI 29.53 kg/m?  ?General:   Alert,  Well-developed, well-nourished, pleasant and cooperative in NAD ?Mouth:  No deformity or lesions. ?Neck:  Supple; no masses or thyromegaly. No significant cervical adenopathy. ?Lungs:  Clear throughout to auscultation.   No wheezes, crackles, or rhonchi. No acute distress. ?Heart:  Regular rate and rhythm; no murmurs, clicks, rubs,  or gallops. ?Abdomen: Non-distended, normal bowel sounds.  Soft and nontender without appreciable mass or hepatosplenomegaly.  ?Pulses:  Normal pulses noted. ?Extremities:  Without clubbing or edema. ? ?Impression/Plan: 45 year old male here for first-ever average risk screening colonoscopy.  Also, patient has had issues with intermittent diarrhea and bloating for 2 years. ? ?I have offered  the patient a colonoscopy today. ?The risks, benefits, limitations, alternatives and imponderables have been reviewed with the patient. Questions have been answered. All parties are agreeable.   ? ? ? ? ?Notice: This dictation was prepared with Dragon dictation along with smaller phrase technology. Any transcriptional errors that result from this process are unintentional and may not be corrected upon review.  ? ?

## 2021-05-31 ENCOUNTER — Encounter: Payer: Self-pay | Admitting: Internal Medicine

## 2021-05-31 LAB — GASTROINTESTINAL PANEL BY PCR, STOOL (REPLACES STOOL CULTURE)

## 2021-05-31 LAB — SURGICAL PATHOLOGY

## 2021-06-05 ENCOUNTER — Encounter (HOSPITAL_COMMUNITY): Payer: Self-pay | Admitting: Internal Medicine

## 2021-06-06 ENCOUNTER — Other Ambulatory Visit: Payer: Self-pay | Admitting: Family Medicine

## 2021-06-06 DIAGNOSIS — I10 Essential (primary) hypertension: Secondary | ICD-10-CM

## 2021-06-06 MED ORDER — ENALAPRIL MALEATE 2.5 MG PO TABS: 2.5000 mg | ORAL_TABLET | Freq: Every day | ORAL | 0 refills | Status: DC

## 2021-09-12 ENCOUNTER — Other Ambulatory Visit: Payer: Self-pay | Admitting: Family Medicine

## 2021-09-12 DIAGNOSIS — I10 Essential (primary) hypertension: Secondary | ICD-10-CM

## 2021-10-08 ENCOUNTER — Other Ambulatory Visit: Payer: Self-pay | Admitting: Family Medicine

## 2021-10-15 ENCOUNTER — Other Ambulatory Visit: Payer: Self-pay | Admitting: Family Medicine

## 2021-10-15 DIAGNOSIS — I10 Essential (primary) hypertension: Secondary | ICD-10-CM

## 2021-12-19 ENCOUNTER — Encounter: Payer: No Typology Code available for payment source | Admitting: Family Medicine

## 2021-12-21 ENCOUNTER — Ambulatory Visit (INDEPENDENT_AMBULATORY_CARE_PROVIDER_SITE_OTHER): Payer: PRIVATE HEALTH INSURANCE | Admitting: Family Medicine

## 2021-12-21 ENCOUNTER — Other Ambulatory Visit (HOSPITAL_COMMUNITY)
Admission: RE | Admit: 2021-12-21 | Discharge: 2021-12-21 | Disposition: A | Discharge status: Home / Self Care | Payer: PRIVATE HEALTH INSURANCE | Source: Ambulatory Visit | Attending: Family Medicine | Admitting: Family Medicine

## 2021-12-21 ENCOUNTER — Encounter: Payer: Self-pay | Admitting: Family Medicine

## 2021-12-21 VITALS — BP 122/77 | HR 108 | Temp 98.0°F | Ht 69.0 in | Wt 201.0 lb

## 2021-12-21 DIAGNOSIS — Z0000 Encounter for general adult medical examination without abnormal findings: Secondary | ICD-10-CM

## 2021-12-21 DIAGNOSIS — I10 Essential (primary) hypertension: Secondary | ICD-10-CM

## 2021-12-21 DIAGNOSIS — N529 Male erectile dysfunction, unspecified: Secondary | ICD-10-CM

## 2021-12-21 DIAGNOSIS — Z0001 Encounter for general adult medical examination with abnormal findings: Secondary | ICD-10-CM

## 2021-12-21 DIAGNOSIS — E782 Mixed hyperlipidemia: Secondary | ICD-10-CM

## 2021-12-21 DIAGNOSIS — K582 Mixed irritable bowel syndrome: Secondary | ICD-10-CM

## 2021-12-21 DIAGNOSIS — K219 Gastro-esophageal reflux disease without esophagitis: Secondary | ICD-10-CM

## 2021-12-21 DIAGNOSIS — R7303 Prediabetes: Secondary | ICD-10-CM

## 2021-12-21 DIAGNOSIS — Z23 Encounter for immunization: Secondary | ICD-10-CM | POA: Diagnosis not present

## 2021-12-21 LAB — BAYER DCA HB A1C WAIVED: HB A1C (BAYER DCA - WAIVED): 6.3 % — ABNORMAL HIGH (ref 4.8–5.6)

## 2021-12-21 MED ORDER — PRAVASTATIN SODIUM 40 MG PO TABS: 40.0000 mg | ORAL_TABLET | Freq: Every day | ORAL | 3 refills | Status: DC

## 2021-12-21 MED ORDER — DICYCLOMINE HCL 10 MG PO CAPS: 10.0000 mg | ORAL_CAPSULE | Freq: Three times a day (TID) | ORAL | 3 refills | Status: DC

## 2021-12-21 MED ORDER — OMEPRAZOLE 40 MG PO CPDR: 40.0000 mg | DELAYED_RELEASE_CAPSULE | Freq: Every day | ORAL | 3 refills | Status: DC

## 2021-12-21 MED ORDER — ENALAPRIL MALEATE 2.5 MG PO TABS: 2.5000 mg | ORAL_TABLET | Freq: Every day | ORAL | 0 refills | Status: DC

## 2021-12-21 MED ORDER — SILDENAFIL CITRATE 100 MG PO TABS: 100.0000 mg | ORAL_TABLET | Freq: Every day | ORAL | 1 refills | Status: DC | PRN

## 2021-12-21 NOTE — Progress Notes (Signed)
BP 122/77   Pulse (!) 108   Temp 98 F (36.7 C)   Ht _0  (1.753 m)   Wt 201 lb (91.2 kg)   SpO2 95%   BMI 29.68 kg/m    Subjective:   Patient ID: James Snyder, male    DOB: 12-Aug-1975, 46 y.o.   MRN: [id number]  HPI: James Snyder is a 46 y.o. male presenting on 12/21/2021 for Medical Management of Chronic Issues (CPE)   HPI Physical exam Patient is coming in today for physical exam and check of chronic medical issues.  He says he had some new sexual partners recently and occasionally after intercourse get burning for a day or 2 and then it goes away on the head of his penis and with urination.  He just wants to get this checked out and is wondering if he can get STD testing.  Patient denies any chest pain, shortness of breath, headaches or vision issues, abdominal complaints, diarrhea, nausea, vomiting, or joint issues.   Prediabetes Patient comes in today for recheck of his diabetes. Patient has been currently taking no medicine currently. Patient is currently on an ACE inhibitor/ARB. Patient has not seen an ophthalmologist this year. Patient denies any issues with their feet. The symptom started onset as an adult hypertension and hyperlipidemia ARE RELATED TO DM, recommend you to see an eye doctor  Hypertension Patient is currently on enalapril, and their blood pressure today is 122/77. Patient denies any lightheadedness or dizziness. Patient denies headaches, blurred vision, chest pains, shortness of breath, or weakness. Denies any side effects from medication and is content with current medication.   Hyperlipidemia Patient is coming in for recheck of his hyperlipidemia. The patient is currently taking pravastatin. They deny any issues with myalgias or history of liver damage from it. They deny any focal numbness or weakness or chest pain.   GERD Patient is currently on omeprazole.  She denies any major symptoms or abdominal pain or belching or burping. She denies any blood  in her stool or lightheadedness or dizziness.   Relevant past medical, surgical, family and social history reviewed and updated as indicated. Interim medical history since our last visit reviewed. Allergies and medications reviewed and updated.  Review of Systems  Constitutional:  Negative for chills and fever.  HENT:  Negative for ear pain and tinnitus.   Eyes:  Negative for pain and discharge.  Respiratory:  Negative for cough, shortness of breath and wheezing.   Cardiovascular:  Negative for chest pain, palpitations and leg swelling.  Gastrointestinal:  Negative for abdominal pain, blood in stool, constipation and diarrhea.  Genitourinary:  Negative for dysuria and hematuria.  Musculoskeletal:  Negative for back pain, gait problem and myalgias.  Skin:  Negative for rash.  Neurological:  Negative for dizziness, weakness and headaches.  Psychiatric/Behavioral:  Negative for suicidal ideas.   All other systems reviewed and are negative.   Per HPI unless specifically indicated above   Allergies as of 12/21/2021       Reactions   Penicillins Hives   Did it involve swelling of the face/tongue/throat, SOB, or low BP? No Did it involve sudden or severe rash/hives, skin peeling, or any reaction on the inside of your mouth or nose? No Did you need to seek medical attention at a hospital or doctor's office? No When did it last happen?      Childhood If all above answers are "NO", may proceed with cephalosporin use.  Medication List        Accurate as of December 21, 2021  2:22 PM. If you have any questions, ask your nurse or doctor.          baclofen 10 MG tablet Commonly known as: LIORESAL Take 10 mg by mouth as needed for muscle spasms.   cetirizine 10 MG tablet Commonly known as: ZYRTEC Take 1 tablet (10 mg total) by mouth daily.   dicyclomine 10 MG capsule Commonly known as: BENTYL Take 1 capsule (10 mg total) by mouth 4 (four) times daily -  before meals and  at bedtime. What changed: See the new instructions. Changed by: Fransisca Kaufmann Vasti Yagi, MD   enalapril 2.5 MG tablet Commonly known as: VASOTEC Take 1 tablet (2.5 mg total) by mouth daily.   fluticasone 50 MCG/ACT nasal spray Commonly known as: FLONASE Place 1 spray into both nostrils 2 (two) times daily. What changed:  when to take this reasons to take this   GOODY HEADACHE PO Take 1 packet by mouth daily as needed (pain).   omeprazole 40 MG capsule Commonly known as: PRILOSEC Take 1 capsule (40 mg total) by mouth daily.   polyethylene glycol-electrolytes 420 g solution Commonly known as: TriLyte Take 4,000 mLs by mouth as directed.   pravastatin 40 MG tablet Commonly known as: PRAVACHOL Take 1 tablet (40 mg total) by mouth daily.   promethazine-dextromethorphan 6.25-15 MG/5ML syrup Commonly known as: PROMETHAZINE-DM Take 5 mLs by mouth 4 (four) times daily as needed.   sildenafil 100 MG tablet Commonly known as: VIAGRA Take 1 tablet (100 mg total) by mouth daily as needed.   valACYclovir 1000 MG tablet Commonly known as: VALTREX TAKE 1 TABLET BY MOUTH EVERY DAY         Objective:   BP 122/77   Pulse (!) 108   Temp 98 F (36.7 C)   Ht _0  (1.753 m)   Wt 201 lb (91.2 kg)   SpO2 95%   BMI 29.68 kg/m   Wt Readings from Last 3 Encounters:  12/21/21 201 lb (91.2 kg)  05/30/21 200 lb (90.7 kg)  04/23/21 203 lb (92.1 kg)    Physical Exam Vitals reviewed.  Constitutional:      General: He is not in acute distress.    Appearance: He is well-developed. He is not diaphoretic.  HENT:     Right Ear: External ear normal.     Left Ear: External ear normal.     Nose: Nose normal.     Mouth/Throat:     Pharynx: No oropharyngeal exudate.  Eyes:     General: No scleral icterus.       Right eye: No discharge.     Conjunctiva/sclera: Conjunctivae normal.     Pupils: Pupils are equal, round, and reactive to light.  Neck:     Thyroid: No thyromegaly.   Cardiovascular:     Rate and Rhythm: Normal rate and regular rhythm.     Heart sounds: Normal heart sounds. No murmur heard. Pulmonary:     Effort: Pulmonary effort is normal. No respiratory distress.     Breath sounds: Normal breath sounds. No wheezing.  Abdominal:     General: Bowel sounds are normal. There is no distension.     Palpations: Abdomen is soft.     Tenderness: There is no abdominal tenderness. There is no guarding or rebound.  Musculoskeletal:        General: Normal range of motion.     Cervical  back: Neck supple.  Lymphadenopathy:     Cervical: No cervical adenopathy.  Skin:    General: Skin is warm and dry.     Findings: No rash.  Neurological:     Mental Status: He is alert and oriented to person, place, and time.     Coordination: Coordination normal.  Psychiatric:        Behavior: Behavior normal.       Assessment & Plan:   Problem List Items Addressed This Visit       Cardiovascular and Mediastinum   Essential hypertension   Relevant Medications   enalapril (VASOTEC) 2.5 MG tablet   pravastatin (PRAVACHOL) 40 MG tablet   sildenafil (VIAGRA) 100 MG tablet   Other Relevant Orders   CMP14+EGFR     Digestive   Acid reflux   Relevant Medications   omeprazole (PRILOSEC) 40 MG capsule   dicyclomine (BENTYL) 10 MG capsule     Other   Pre-diabetes   Relevant Orders   CBC with Differential/Platelet   CMP14+EGFR   Bayer DCA Hb A1c Waived   Mixed hyperlipidemia   Relevant Medications   enalapril (VASOTEC) 2.5 MG tablet   pravastatin (PRAVACHOL) 40 MG tablet   sildenafil (VIAGRA) 100 MG tablet   Other Relevant Orders   Lipid panel   Erectile dysfunction   Relevant Medications   sildenafil (VIAGRA) 100 MG tablet   Other Visit Diagnoses     Physical exam    -  Primary   Relevant Orders   PSA, total and free   RPR   HepB+HepC+HIV Panel   Urine cytology ancillary only   Irritable bowel syndrome with both constipation and diarrhea        Relevant Medications   omeprazole (PRILOSEC) 40 MG capsule   dicyclomine (BENTYL) 10 MG capsule       Will do STD testing because of new sexual partner. Follow up plan: Return in about 6 months (around 06/22/2022), or if symptoms worsen or fail to improve, for Hypertension and cholesterol.  Counseling provided for all of the vaccine components Orders Placed This Encounter  Procedures   PSA, total and free   RPR   HepB+HepC+HIV Panel   CBC with Differential/Platelet   CMP14+EGFR   Lipid panel   Bayer DCA Hb A1c Waived    Caryl Pina, MD [facility] 12/21/2021, 2:22 PM

## 2021-12-21 NOTE — Progress Notes (Signed)
Refill failed. resent °

## 2021-12-21 NOTE — Addendum Note (Signed)
Addended by: Julious Payer D on: 12/21/2021 04:53 PM   Modules accepted: Orders

## 2021-12-23 ENCOUNTER — Other Ambulatory Visit: Payer: Self-pay | Admitting: Family Medicine

## 2021-12-23 DIAGNOSIS — J301 Allergic rhinitis due to pollen: Secondary | ICD-10-CM

## 2021-12-25 LAB — CMP14+EGFR
ALT: 34 IU/L (ref 0–44)
AST: 26 IU/L (ref 0–40)
Albumin/Globulin Ratio: 1.6 (ref 1.2–2.2)
Albumin: 4.4 g/dL (ref 4.1–5.1)
Alkaline Phosphatase: 72 IU/L (ref 44–121)
BUN/Creatinine Ratio: 7 — ABNORMAL LOW (ref 9–20)
BUN: 6 mg/dL (ref 6–24)
Bilirubin Total: 0.4 mg/dL (ref 0.0–1.2)
CO2: 21 mmol/L (ref 20–29)
Calcium: 9.3 mg/dL (ref 8.7–10.2)
Chloride: 105 mmol/L (ref 96–106)
Creatinine, Ser: 0.85 mg/dL (ref 0.76–1.27)
Globulin, Total: 2.7 g/dL (ref 1.5–4.5)
Glucose: 100 mg/dL — ABNORMAL HIGH (ref 70–99)
Potassium: 4.3 mmol/L (ref 3.5–5.2)
Sodium: 140 mmol/L (ref 134–144)
Total Protein: 7.1 g/dL (ref 6.0–8.5)
eGFR: 108 mL/min/{1.73_m2} (ref >59)

## 2021-12-25 LAB — LIPID PANEL
Chol/HDL Ratio: 5.6 ratio — ABNORMAL HIGH (ref 0.0–5.0)
Cholesterol, Total: 211 mg/dL — ABNORMAL HIGH (ref 100–199)
HDL: 38 mg/dL — ABNORMAL LOW (ref >39)
LDL Chol Calc (NIH): 127 mg/dL — ABNORMAL HIGH (ref 0–99)
Triglycerides: 260 mg/dL — ABNORMAL HIGH (ref 0–149)
VLDL Cholesterol Cal: 46 mg/dL — ABNORMAL HIGH (ref 5–40)

## 2021-12-25 LAB — CBC WITH DIFFERENTIAL/PLATELET
Basophils Absolute: 0 10*3/uL (ref 0.0–0.2)
Basos: 1 %
EOS (ABSOLUTE): 0 10*3/uL (ref 0.0–0.4)
Eos: 1 %
Hematocrit: 43.2 % (ref 37.5–51.0)
Hemoglobin: 15.1 g/dL (ref 13.0–17.7)
Immature Grans (Abs): 0 10*3/uL (ref 0.0–0.1)
Immature Granulocytes: 0 %
Lymphocytes Absolute: 2.3 10*3/uL (ref 0.7–3.1)
Lymphs: 46 %
MCH: 30 pg (ref 26.6–33.0)
MCHC: 35 g/dL (ref 31.5–35.7)
MCV: 86 fL (ref 79–97)
Monocytes Absolute: 0.5 10*3/uL (ref 0.1–0.9)
Monocytes: 11 %
Neutrophils Absolute: 2 10*3/uL (ref 1.4–7.0)
Neutrophils: 41 %
Platelets: 315 10*3/uL (ref 150–450)
RBC: 5.03 x10E6/uL (ref 4.14–5.80)
RDW: 15.4 % (ref 11.6–15.4)
WBC: 4.8 10*3/uL (ref 3.4–10.8)

## 2021-12-25 LAB — RPR: RPR Ser Ql: NONREACTIVE

## 2021-12-25 LAB — HEPB+HEPC+HIV PANEL
HIV Screen 4th Generation wRfx: NONREACTIVE
Hep B C IgM: NEGATIVE
Hep B Core Total Ab: NEGATIVE
Hep B E Ab: NEGATIVE
Hep B E Ag: NEGATIVE
Hep B Surface Ab, Qual: REACTIVE
Hep C Virus Ab: NONREACTIVE
Hepatitis B Surface Ag: NEGATIVE

## 2021-12-25 LAB — URINE CYTOLOGY ANCILLARY ONLY
Chlamydia: NEGATIVE
Comment: NEGATIVE
Comment: NORMAL
Neisseria Gonorrhea: NEGATIVE

## 2021-12-25 LAB — PSA, TOTAL AND FREE
PSA, Free Pct: 30 %
PSA, Free: 0.33 ng/mL
Prostate Specific Ag, Serum: 1.1 ng/mL (ref 0.0–4.0)

## 2022-01-11 ENCOUNTER — Other Ambulatory Visit: Payer: Self-pay | Admitting: Family Medicine

## 2022-01-11 DIAGNOSIS — N529 Male erectile dysfunction, unspecified: Secondary | ICD-10-CM

## 2022-01-11 DIAGNOSIS — K219 Gastro-esophageal reflux disease without esophagitis: Secondary | ICD-10-CM

## 2022-01-11 NOTE — Telephone Encounter (Signed)
Unable to refill per protocol, last refill by another provider not at practice. Will refuse.  Requested Prescriptions  Pending Prescriptions Disp Refills   fluticasone (FLONASE) 50 MCG/ACT nasal spray [Pharmacy Med Name: FLUTICASONE PROP 50 MCG SPRAY] 16 mL     Sig: PLACE 1 SPRAY INTO BOTH NOSTRILS 2 (TWO) TIMES DAILY     There is no refill protocol information for this order

## 2022-01-15 ENCOUNTER — Other Ambulatory Visit (HOSPITAL_COMMUNITY): Payer: Self-pay

## 2022-01-15 NOTE — Telephone Encounter (Signed)
Per test claim, insurance will pay for 6 tablets every 30 days. Contacted pharmacy and they will have it filled this evening for pt.

## 2022-01-16 MED ORDER — SILDENAFIL CITRATE 100 MG PO TABS: 100.0000 mg | ORAL_TABLET | Freq: Every day | ORAL | 1 refills | Status: DC | PRN

## 2022-01-16 NOTE — Telephone Encounter (Signed)
Refill failed. resent °

## 2022-01-16 NOTE — Addendum Note (Signed)
Addended by: Antonietta Barcelona D on: 01/16/2022 10:26 AM   Modules accepted: Orders

## 2022-03-13 ENCOUNTER — Encounter: Payer: Self-pay | Admitting: Family Medicine

## 2022-04-10 ENCOUNTER — Other Ambulatory Visit: Payer: Self-pay | Admitting: Family Medicine

## 2022-04-10 DIAGNOSIS — I10 Essential (primary) hypertension: Secondary | ICD-10-CM

## 2022-05-03 ENCOUNTER — Ambulatory Visit
Admission: EM | Admit: 2022-05-03 | Discharge: 2022-05-03 | Disposition: A | Discharge status: Home / Self Care | Payer: PRIVATE HEALTH INSURANCE | Attending: Physician Assistant | Admitting: Physician Assistant

## 2022-05-03 ENCOUNTER — Encounter: Payer: Self-pay | Admitting: Emergency Medicine

## 2022-05-03 DIAGNOSIS — L282 Other prurigo: Secondary | ICD-10-CM | POA: Diagnosis not present

## 2022-05-03 DIAGNOSIS — S30861A Insect bite (nonvenomous) of abdominal wall, initial encounter: Secondary | ICD-10-CM | POA: Diagnosis not present

## 2022-05-03 DIAGNOSIS — W57XXXA Bitten or stung by nonvenomous insect and other nonvenomous arthropods, initial encounter: Secondary | ICD-10-CM

## 2022-05-03 MED ORDER — TRIAMCINOLONE ACETONIDE 0.1 % EX CREA: 1.0000 | TOPICAL_CREAM | Freq: Two times a day (BID) | CUTANEOUS | 0 refills | Status: DC

## 2022-05-03 MED ORDER — CETIRIZINE HCL 10 MG PO TABS: 10.0000 mg | ORAL_TABLET | Freq: Every day | ORAL | 0 refills | Status: DC

## 2022-05-03 MED ORDER — DOXYCYCLINE HYCLATE 100 MG PO CAPS: 200.0000 mg | ORAL_CAPSULE | Freq: Once | ORAL | 0 refills | Status: AC

## 2022-05-03 NOTE — ED Triage Notes (Signed)
Tick bite on left side.  States he removed the tick last night.  States he now has other itchy placed showing up all over his body.

## 2022-05-03 NOTE — Discharge Instructions (Signed)
I am more concerned about exposure to triggers than tickborne illness.  Please wash the area with warm soap and water and apply Kenalog cream.  Take cetirizine at night to help with the itching.  Since you did have a tick on you we are going to prophylactically treat for tickborne illness with doxycycline 200 mg one-time.  Make sure that you are using hypoallergenic soaps and detergents and wear loosefitting cotton clothing.  If your symptoms or not improving or if anything worsens you need to be seen immediately.

## 2022-05-03 NOTE — ED Provider Notes (Signed)
[facility]    CSN: [id number] Arrival date & time: 05/03/22  0818      History   Chief Complaint No chief complaint on file.   HPI James Snyder is a 47 y.o. male.   Patient presents today with a several day history of pruritic rash.  He reports a several pruritic lesions on his lateral left hip and on his medial left ankle.  Around the time he started itching he removed a tick from this region.  He reports that it is relatively small and did not appear to be engorged blood but he is unsure how long it would have been attached.  He denies any additional changes to his personal hygiene products including soaps or detergents.  Denies any recent medication changes or antibiotic use.  He does report that he works as a Emergency planning/management officer and prior to symptoms beginning he had to run through a grass field but denies any additional new exposures.  He has not tried any over-the-counter medication for symptom management.  Denies history of dermatological condition.  Denies any associated symptoms including fever, nausea, vomiting, facial asymmetry, headache, dizziness, palpitations, weakness.    Past Medical History:  Diagnosis Date   Allergy    Erectile dysfunction    GERD (gastroesophageal reflux disease)    Hyperlipidemia    Hypertension    SVT (supraventricular tachycardia)     Patient Active Problem List   Diagnosis Date Noted   Mixed hyperlipidemia 01/01/2019   Seasonal allergic rhinitis due to pollen 01/01/2019   Pre-diabetes 02/29/2016   BMI 29.0-29.9,adult 12/24/2014   Erectile dysfunction 12/21/2014   Essential hypertension 04/28/2014   Acid reflux 04/28/2014   Seasonal allergies 04/28/2014    Past Surgical History:  Procedure Laterality Date   COLONOSCOPY WITH PROPOFOL N/A 05/30/2021   Procedure: COLONOSCOPY WITH PROPOFOL;  Surgeon: Corbin Ade, MD;  Location: [facility];  Service: Endoscopy;  Laterality: N/A;  1:30pm   POLYPECTOMY  05/30/2021    Procedure: POLYPECTOMY;  Surgeon: Corbin Ade, MD;  Location: [facility];  Service: Endoscopy;;   SVT ABLATION N/A 11/18/2018   Procedure: SVT ABLATION;  Surgeon: Hillis Range, MD;  Location: [facility];  Service: Cardiovascular;  Laterality: N/A;       Home Medications    Prior to Admission medications   Medication Sig Start Date End Date Taking? Authorizing Provider  cetirizine (ZYRTEC ALLERGY) 10 MG tablet Take 1 tablet (10 mg total) by mouth at bedtime. 05/03/22  Yes Jamira Barfuss K, PA-C  doxycycline (VIBRAMYCIN) 100 MG capsule Take 2 capsules (200 mg total) by mouth once for 1 dose. 05/03/22 05/03/22 Yes Kohl Polinsky, Denny Peon K, PA-C  triamcinolone cream (KENALOG) 0.1 % Apply 1 Application topically 2 (two) times daily. 05/03/22  Yes Crislyn Willbanks K, PA-C  Aspirin-Acetaminophen-Caffeine (GOODY HEADACHE PO) Take 1 packet by mouth daily as needed (pain).    [provider]  baclofen (LIORESAL) 10 MG tablet Take 10 mg by mouth as needed for muscle spasms.    [provider]  dicyclomine (BENTYL) 10 MG capsule Take 1 capsule (10 mg total) by mouth 4 (four) times daily -  before meals and at bedtime. 12/21/21   Dettinger, Elige Radon, MD  enalapril (VASOTEC) 2.5 MG tablet TAKE 1 TABLET BY MOUTH EVERY DAY 04/10/22   Dettinger, Elige Radon, MD  fluticasone (FLONASE) 50 MCG/ACT nasal spray Place 1 spray into both nostrils 2 (two) times daily. Patient taking differently: Place 1 spray  into both nostrils 2 (two) times daily as needed for allergies. 03/14/21   Particia Nearing, PA-C  omeprazole (PRILOSEC) 40 MG capsule Take 1 capsule (40 mg total) by mouth daily. 12/21/21   Dettinger, Elige Radon, MD  polyethylene glycol-electrolytes (TRILYTE) 420 g solution Take 4,000 mLs by mouth as directed. 04/23/21   Rourk, Gerrit Friends, MD  pravastatin (PRAVACHOL) 40 MG tablet Take 1 tablet (40 mg total) by mouth daily. 12/21/21   Dettinger, Elige Radon, MD  sildenafil (VIAGRA) 100 MG tablet Take 1 tablet  (100 mg total) by mouth daily as needed. 01/16/22   Dettinger, Elige Radon, MD  valACYclovir (VALTREX) 1000 MG tablet TAKE 1 TABLET BY MOUTH EVERY DAY 10/08/21   Dettinger, Elige Radon, MD    Family History Family History  Problem Relation Age of Onset   Cancer Mother    Heart disease Father    Stroke Brother    Colon cancer Neg Hx    Inflammatory bowel disease Neg Hx     Social History Social History   Tobacco Use   Smoking status: Former    Types: Cigars   Smokeless tobacco: Former   Tobacco comments:    every once in a while  Vaping Use   Vaping Use: Never used  Substance Use Topics   Alcohol use: No    Alcohol/week: 0.0 standard drinks of alcohol   Drug use: Never     Allergies   Penicillins   Review of Systems Review of Systems  Constitutional:  Positive for activity change. Negative for appetite change, fatigue and fever.  Gastrointestinal:  Negative for abdominal pain, diarrhea, nausea and vomiting.  Musculoskeletal:  Negative for arthralgias and myalgias.  Skin:  Positive for rash.  Neurological:  Negative for dizziness, light-headedness and headaches.     Physical Exam Triage Vital Signs ED Triage Vitals [05/03/22 0858]  Enc Vitals Group     BP 128/86     Pulse Rate 96     Resp 18     Temp 98.8 F (37.1 C)     Temp Source Oral     SpO2 95 %     Weight      Height      Head Circumference      Peak Flow      Pain Score 0     Pain Loc      Pain Edu?      Excl. in GC?    No data found.  Updated Vital Signs BP 128/86 (BP Location: Right Arm)   Pulse 96   Temp 98.8 F (37.1 C) (Oral)   Resp 18   SpO2 95%   Visual Acuity Right Eye Distance:   Left Eye Distance:   Bilateral Distance:    Right Eye Near:   Left Eye Near:    Bilateral Near:     Physical Exam Vitals reviewed.  Constitutional:      General: He is awake.     Appearance: Normal appearance. He is well-developed. He is not ill-appearing.     Comments: Very pleasant male appears  stated age in no acute distress sitting comfortably in exam room  HENT:     Head: Normocephalic and atraumatic.     Mouth/Throat:     Pharynx: No oropharyngeal exudate, posterior oropharyngeal erythema or uvula swelling.  Cardiovascular:     Rate and Rhythm: Normal rate and regular rhythm.     Heart sounds: Normal heart sounds, S1 normal and S2 normal. No  murmur heard. Pulmonary:     Effort: Pulmonary effort is normal.     Breath sounds: Normal breath sounds. No stridor. No wheezing, rhonchi or rales.     Comments: Clear to auscultation bilaterally Skin:    Findings: Rash present. Rash is papular.          Comments: Multiple erythematous papules noted along beltline and left lower ankle with evidence of excoriation.  No additional rash noted.  Neurological:     Mental Status: He is alert.  Psychiatric:        Behavior: Behavior is cooperative.      UC Treatments / Results  Labs (all labs ordered are listed, but only abnormal results are displayed) Labs Reviewed - No data to display  EKG   Radiology No results found.  Procedures Procedures (including critical care time)  Medications Ordered in UC Medications - No data to display  Initial Impression / Assessment and Plan / UC Course  I have reviewed the triage vital signs and the nursing notes.  Pertinent labs & imaging results that were available during my care of the patient were reviewed by me and considered in my medical decision making (see chart for details).     Patient is well-appearing, afebrile, nontoxic, nontachycardic.  Rash is widespread and not consistent with tickborne illness.  We will treat prophylactically for tickborne illness with doxycycline 200 mg one-time given recent tick bite.  I am more concerned for chiggers is contributing to his symptoms and he was encouraged to clean the area with soap and water and use antihistamines to help manage symptoms.  Prescription for Zyrtec was sent to pharmacy to  begin this at night.  He will use triamcinolone cream for additional symptom relief.  Discussed that if his symptoms or not improving or if he has any worsening or changing symptoms he needs to be seen immediately.  Strict return precautions given.  Offered work excuse note which she declined.  Final Clinical Impressions(s) / UC Diagnoses   Final diagnoses:  Pruritic rash  Tick bite of abdominal wall, initial encounter     Discharge Instructions      I am more concerned about exposure to triggers than tickborne illness.  Please wash the area with warm soap and water and apply Kenalog cream.  Take cetirizine at night to help with the itching.  Since you did have a tick on you we are going to prophylactically treat for tickborne illness with doxycycline 200 mg one-time.  Make sure that you are using hypoallergenic soaps and detergents and wear loosefitting cotton clothing.  If your symptoms or not improving or if anything worsens you need to be seen immediately.     ED Prescriptions     Medication Sig Dispense Auth. Provider   doxycycline (VIBRAMYCIN) 100 MG capsule Take 2 capsules (200 mg total) by mouth once for 1 dose. 2 capsule Nixon Kolton K, PA-C   cetirizine (ZYRTEC ALLERGY) 10 MG tablet Take 1 tablet (10 mg total) by mouth at bedtime. 30 tablet Mykira Hofmeister K, PA-C   triamcinolone cream (KENALOG) 0.1 % Apply 1 Application topically 2 (two) times daily. 30 g Nilsa Macht, Noberto Retort, PA-C      PDMP not reviewed this encounter.   Jeani Hawking, PA-C 05/03/22 [id number]

## 2022-05-14 ENCOUNTER — Other Ambulatory Visit: Payer: Self-pay | Admitting: Family Medicine

## 2022-05-14 DIAGNOSIS — I10 Essential (primary) hypertension: Secondary | ICD-10-CM

## 2022-05-14 DIAGNOSIS — N529 Male erectile dysfunction, unspecified: Secondary | ICD-10-CM

## 2022-10-14 ENCOUNTER — Other Ambulatory Visit: Payer: Self-pay | Admitting: Family Medicine

## 2022-10-14 DIAGNOSIS — I10 Essential (primary) hypertension: Secondary | ICD-10-CM

## 2022-11-07 ENCOUNTER — Encounter: Payer: Self-pay | Admitting: Family Medicine

## 2022-11-07 ENCOUNTER — Ambulatory Visit (INDEPENDENT_AMBULATORY_CARE_PROVIDER_SITE_OTHER): Payer: 59 | Admitting: Family Medicine

## 2022-11-07 VITALS — BP 124/84 | HR 95 | Ht 69.0 in | Wt 200.0 lb

## 2022-11-07 DIAGNOSIS — R7303 Prediabetes: Secondary | ICD-10-CM | POA: Diagnosis not present

## 2022-11-07 DIAGNOSIS — Z0000 Encounter for general adult medical examination without abnormal findings: Secondary | ICD-10-CM

## 2022-11-07 DIAGNOSIS — K582 Mixed irritable bowel syndrome: Secondary | ICD-10-CM | POA: Diagnosis not present

## 2022-11-07 DIAGNOSIS — Z0001 Encounter for general adult medical examination with abnormal findings: Secondary | ICD-10-CM

## 2022-11-07 DIAGNOSIS — E782 Mixed hyperlipidemia: Secondary | ICD-10-CM

## 2022-11-07 DIAGNOSIS — Z23 Encounter for immunization: Secondary | ICD-10-CM | POA: Diagnosis not present

## 2022-11-07 DIAGNOSIS — I10 Essential (primary) hypertension: Secondary | ICD-10-CM | POA: Diagnosis not present

## 2022-11-07 DIAGNOSIS — K219 Gastro-esophageal reflux disease without esophagitis: Secondary | ICD-10-CM

## 2022-11-07 LAB — BAYER DCA HB A1C WAIVED: HB A1C (BAYER DCA - WAIVED): 5.5 % (ref 4.8–5.6)

## 2022-11-07 MED ORDER — DICYCLOMINE HCL 10 MG PO CAPS: 10.0000 mg | ORAL_CAPSULE | Freq: Three times a day (TID) | ORAL | 3 refills | Status: DC

## 2022-11-07 MED ORDER — VALACYCLOVIR HCL 1 G PO TABS: 1000.0000 mg | ORAL_TABLET | Freq: Every day | ORAL | 3 refills | Status: DC

## 2022-11-07 MED ORDER — PRAVASTATIN SODIUM 40 MG PO TABS: 40.0000 mg | ORAL_TABLET | Freq: Every day | ORAL | 3 refills | Status: DC

## 2022-11-07 MED ORDER — OMEPRAZOLE 40 MG PO CPDR: 40.0000 mg | DELAYED_RELEASE_CAPSULE | Freq: Every day | ORAL | 3 refills | Status: DC

## 2022-11-07 MED ORDER — ENALAPRIL MALEATE 2.5 MG PO TABS: 2.5000 mg | ORAL_TABLET | Freq: Every day | ORAL | 3 refills | Status: DC

## 2022-11-07 NOTE — Progress Notes (Signed)
BP 124/84   Pulse 95   Ht 5\' 9"  (1.753 m)   Wt 200 lb (90.7 kg)   SpO2 96%   BMI 29.53 kg/m    Subjective:   Patient ID: James Snyder, male    DOB: 1975-02-27, 47 y.o.   MRN: [id number]  HPI: James Snyder is a 47 y.o. male presenting on 11/07/2022 for Medical Management of Chronic Issues (CPE)   HPI Physical exam Patient denies any chest pain, shortness of breath, headaches or vision issues, abdominal complaints, diarrhea, nausea, vomiting, or joint issues.   Hypertension Patient is currently on enalapril, and their blood pressure today is 124/84. Patient denies any lightheadedness or dizziness. Patient denies headaches, blurred vision, chest pains, shortness of breath, or weakness. Denies any side effects from medication and is content with current medication.   Hyperlipidemia Patient is coming in for recheck of his hyperlipidemia. The patient is currently taking pravastatin. They deny any issues with myalgias or history of liver damage from it. They deny any focal numbness or weakness or chest pain.   GERD Patient is currently on omeprazole.  She denies any major symptoms or abdominal pain or belching or burping. She denies any blood in her stool or lightheadedness or dizziness.   Prediabetes Patient comes in today for recheck of his diabetes. Patient has been currently taking no medicine currently, diet control. Patient is currently on an ACE inhibitor/ARB. Patient has not seen an ophthalmologist this year. Patient denies any new issues with their feet. The symptom started onset as an adult hypertension and hyperlipidemia ARE RELATED TO DM   Relevant past medical, surgical, family and social history reviewed and updated as indicated. Interim medical history since our last visit reviewed. Allergies and medications reviewed and updated.  Review of Systems  Constitutional:  Negative for chills and fever.  HENT:  Negative for ear pain and tinnitus.   Eyes:  Negative for  pain and visual disturbance.  Respiratory:  Negative for cough, shortness of breath and wheezing.   Cardiovascular:  Negative for chest pain, palpitations and leg swelling.  Gastrointestinal:  Negative for abdominal pain, blood in stool, constipation and diarrhea.  Genitourinary:  Negative for dysuria and hematuria.  Musculoskeletal:  Negative for back pain, gait problem and myalgias.  Skin:  Negative for rash.  Neurological:  Negative for dizziness, weakness and headaches.  Psychiatric/Behavioral:  Negative for suicidal ideas.   All other systems reviewed and are negative.   Per HPI unless specifically indicated above   Allergies as of 11/07/2022       Reactions   Penicillins Hives   Did it involve swelling of the face/tongue/throat, SOB, or low BP? No Did it involve sudden or severe rash/hives, skin peeling, or any reaction on the inside of your mouth or nose? No Did you need to seek medical attention at a hospital or doctor's office? No When did it last happen?      Childhood If all above answers are "NO", may proceed with cephalosporin use.        Medication List        Accurate as of November 07, 2022 11:09 AM. If you have any questions, ask your nurse or doctor.          STOP taking these medications    polyethylene glycol-electrolytes 420 g solution Commonly known as: TriLyte Stopped by: Elige Radon Katlin Bortner       TAKE these medications    baclofen 10 MG tablet Commonly known  as: LIORESAL Take 10 mg by mouth as needed for muscle spasms.   cetirizine 10 MG tablet Commonly known as: ZyrTEC Allergy Take 1 tablet (10 mg total) by mouth at bedtime.   dicyclomine 10 MG capsule Commonly known as: BENTYL Take 1 capsule (10 mg total) by mouth 4 (four) times daily -  before meals and at bedtime.   enalapril 2.5 MG tablet Commonly known as: VASOTEC Take 1 tablet (2.5 mg total) by mouth daily.   fluticasone 50 MCG/ACT nasal spray Commonly known as:  FLONASE Place 1 spray into both nostrils 2 (two) times daily. What changed:  when to take this reasons to take this   GOODY HEADACHE PO Take 1 packet by mouth daily as needed (pain).   omeprazole 40 MG capsule Commonly known as: PRILOSEC Take 1 capsule (40 mg total) by mouth daily.   pravastatin 40 MG tablet Commonly known as: PRAVACHOL Take 1 tablet (40 mg total) by mouth daily.   sildenafil 100 MG tablet Commonly known as: VIAGRA TAKE 1 TABLET (100 MG TOTAL) BY MOUTH DAILY AS NEEDED.   triamcinolone cream 0.1 % Commonly known as: KENALOG Apply 1 Application topically 2 (two) times daily.   valACYclovir 1000 MG tablet Commonly known as: VALTREX Take 1 tablet (1,000 mg total) by mouth daily.         Objective:   BP 124/84   Pulse 95   Ht 5\' 9"  (1.753 m)   Wt 200 lb (90.7 kg)   SpO2 96%   BMI 29.53 kg/m   Wt Readings from Last 3 Encounters:  11/07/22 200 lb (90.7 kg)  12/21/21 201 lb (91.2 kg)  05/30/21 200 lb (90.7 kg)    Physical Exam Vitals reviewed.  Constitutional:      General: He is not in acute distress.    Appearance: He is well-developed. He is not diaphoretic.  HENT:     Right Ear: External ear normal.     Left Ear: External ear normal.     Nose: Nose normal.     Mouth/Throat:     Pharynx: No oropharyngeal exudate.  Eyes:     General: No scleral icterus.       Right eye: No discharge.     Conjunctiva/sclera: Conjunctivae normal.     Pupils: Pupils are equal, round, and reactive to light.  Neck:     Thyroid: No thyromegaly.  Cardiovascular:     Rate and Rhythm: Normal rate and regular rhythm.     Heart sounds: Normal heart sounds. No murmur heard. Pulmonary:     Effort: Pulmonary effort is normal. No respiratory distress.     Breath sounds: Normal breath sounds. No wheezing.  Abdominal:     General: Bowel sounds are normal. There is no distension.     Palpations: Abdomen is soft.     Tenderness: There is no abdominal tenderness.  There is no guarding or rebound.  Musculoskeletal:        General: No swelling. Normal range of motion.     Cervical back: Neck supple.  Lymphadenopathy:     Cervical: No cervical adenopathy.  Skin:    General: Skin is warm and dry.     Findings: No rash.  Neurological:     Mental Status: He is alert and oriented to person, place, and time.     Coordination: Coordination normal.  Psychiatric:        Behavior: Behavior normal.       Assessment & Plan:  Problem List Items Addressed This Visit       Cardiovascular and Mediastinum   Essential hypertension   Relevant Medications   enalapril (VASOTEC) 2.5 MG tablet   pravastatin (PRAVACHOL) 40 MG tablet   Other Relevant Orders   CMP14+EGFR   Lipid panel     Digestive   Acid reflux   Relevant Medications   dicyclomine (BENTYL) 10 MG capsule   omeprazole (PRILOSEC) 40 MG capsule   Other Relevant Orders   CBC with Differential/Platelet     Other   Pre-diabetes   Relevant Orders   Bayer DCA Hb A1c Waived   Mixed hyperlipidemia   Relevant Medications   enalapril (VASOTEC) 2.5 MG tablet   pravastatin (PRAVACHOL) 40 MG tablet   Other Relevant Orders   Lipid panel   Other Visit Diagnoses     Physical exam    -  Primary   Relevant Orders   CBC with Differential/Platelet   CMP14+EGFR   Lipid panel   Bayer DCA Hb A1c Waived   Irritable bowel syndrome with both constipation and diarrhea       Relevant Medications   dicyclomine (BENTYL) 10 MG capsule   omeprazole (PRILOSEC) 40 MG capsule       Continue current medicine, seems to be doing well, will check blood work today. Follow up plan: Return in about 1 year (around 11/07/2023), or if symptoms worsen or fail to improve, for physical exam.  Counseling provided for all of the vaccine components Orders Placed This Encounter  Procedures   CBC with Differential/Platelet   CMP14+EGFR   Lipid panel   Bayer DCA Hb A1c Waived    Arville Care, MD [facility] 11/07/2022, 11:09 AM

## 2022-11-08 LAB — CBC WITH DIFFERENTIAL/PLATELET
Basophils Absolute: 0 10*3/uL (ref 0.0–0.2)
Basos: 1 %
EOS (ABSOLUTE): 0 10*3/uL (ref 0.0–0.4)
Eos: 1 %
Hematocrit: 40.7 % (ref 37.5–51.0)
Hemoglobin: 13.7 g/dL (ref 13.0–17.7)
Immature Grans (Abs): 0 10*3/uL (ref 0.0–0.1)
Immature Granulocytes: 1 %
Lymphocytes Absolute: 2.3 10*3/uL (ref 0.7–3.1)
Lymphs: 36 %
MCH: 30.4 pg (ref 26.6–33.0)
MCHC: 33.7 g/dL (ref 31.5–35.7)
MCV: 90 fL (ref 79–97)
Monocytes Absolute: 0.8 10*3/uL (ref 0.1–0.9)
Monocytes: 12 %
Neutrophils Absolute: 3.3 10*3/uL (ref 1.4–7.0)
Neutrophils: 49 %
Platelets: 280 10*3/uL (ref 150–450)
RBC: 4.5 x10E6/uL (ref 4.14–5.80)
RDW: 15 % (ref 11.6–15.4)
WBC: 6.5 10*3/uL (ref 3.4–10.8)

## 2022-11-08 LAB — CMP14+EGFR
ALT: 21 [IU]/L (ref 0–44)
AST: 20 [IU]/L (ref 0–40)
Albumin: 4.1 g/dL (ref 4.1–5.1)
Alkaline Phosphatase: 66 [IU]/L (ref 44–121)
BUN/Creatinine Ratio: 11 (ref 9–20)
BUN: 9 mg/dL (ref 6–24)
Bilirubin Total: 0.2 mg/dL (ref 0.0–1.2)
CO2: 19 mmol/L — ABNORMAL LOW (ref 20–29)
Calcium: 8.9 mg/dL (ref 8.7–10.2)
Chloride: 104 mmol/L (ref 96–106)
Creatinine, Ser: 0.81 mg/dL (ref 0.76–1.27)
Globulin, Total: 2.3 g/dL (ref 1.5–4.5)
Glucose: 100 mg/dL — ABNORMAL HIGH (ref 70–99)
Potassium: 4.1 mmol/L (ref 3.5–5.2)
Sodium: 137 mmol/L (ref 134–144)
Total Protein: 6.4 g/dL (ref 6.0–8.5)
eGFR: 109 mL/min/{1.73_m2} (ref >59)

## 2022-11-08 LAB — LIPID PANEL
Chol/HDL Ratio: 5.5 ratio — ABNORMAL HIGH (ref 0.0–5.0)
Cholesterol, Total: 175 mg/dL (ref 100–199)
HDL: 32 mg/dL — ABNORMAL LOW (ref >39)
LDL Chol Calc (NIH): 75 mg/dL (ref 0–99)
Triglycerides: 431 mg/dL — ABNORMAL HIGH (ref 0–149)
VLDL Cholesterol Cal: 68 mg/dL — ABNORMAL HIGH (ref 5–40)

## 2022-11-21 IMAGING — US US RENAL
1 series · 14 of 25 positions shown · non-contrast
Comparison: None.

CLINICAL DATA: Intermittent left flank pain since December 2019

EXAM:
RENAL / URINARY TRACT ULTRASOUND COMPLETE

[Series 1: us renal · 14 of 45 slices shown]
[im 1/45]
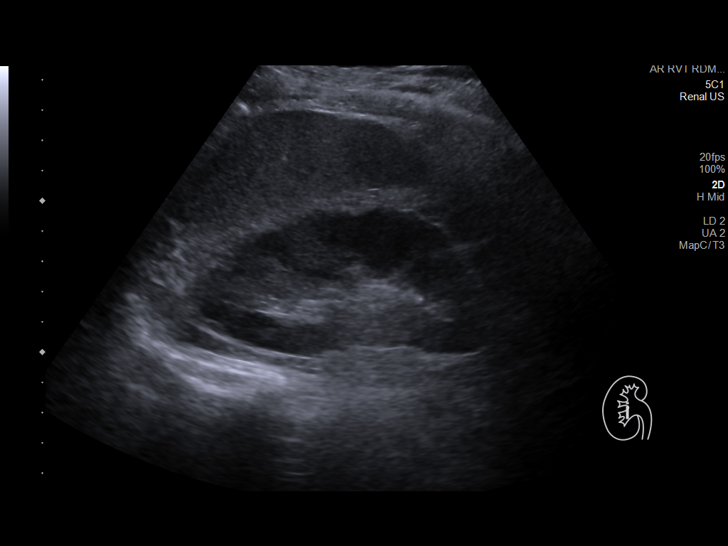
[im 4/45]
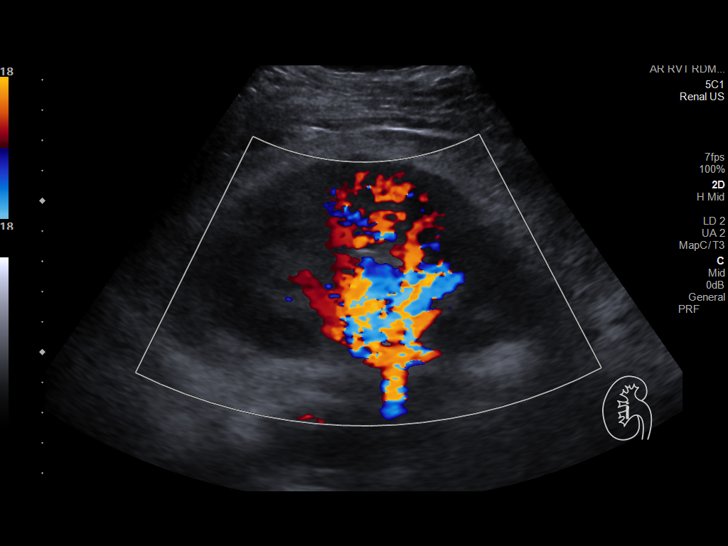
[im 8/45]
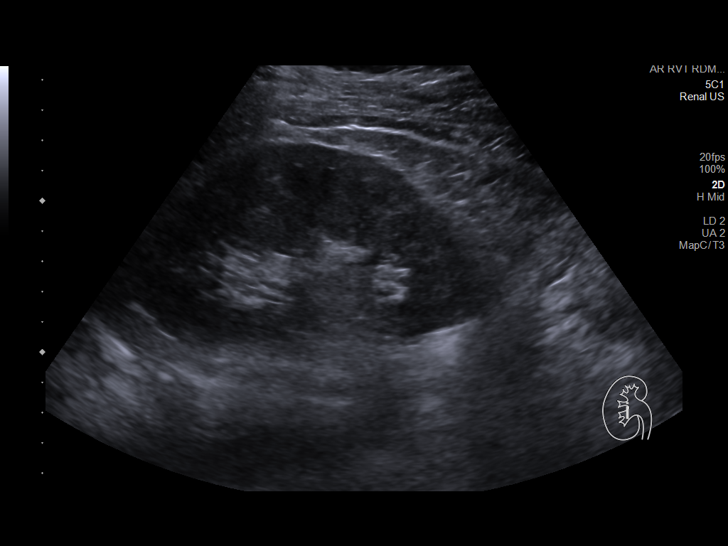
[im 12/45]
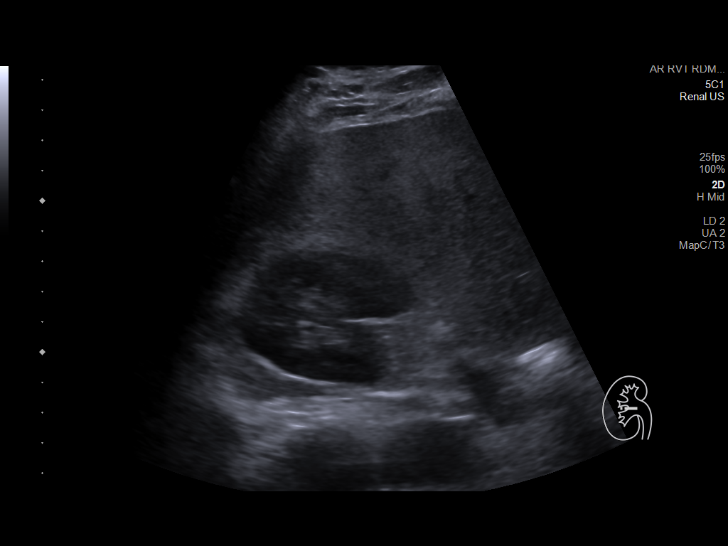
[im 15/45]
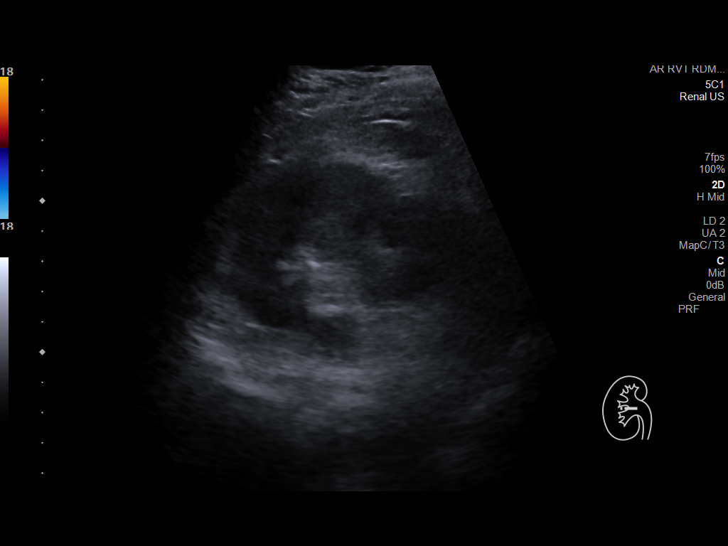
[im 17/45]
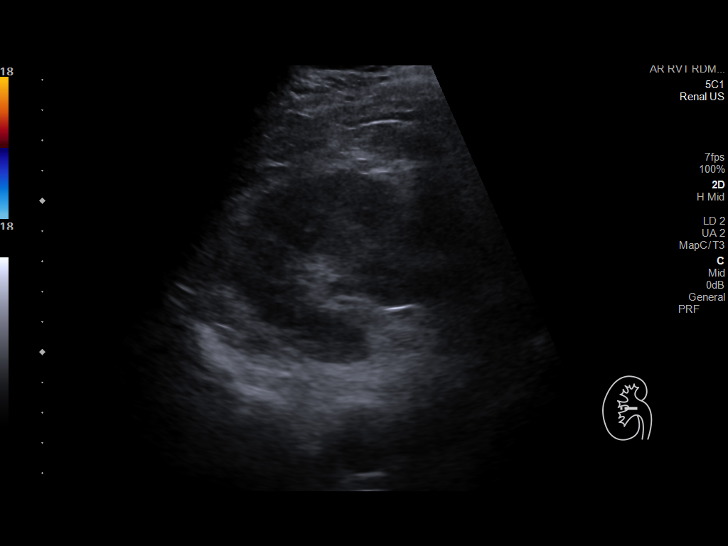
[im 21/45]
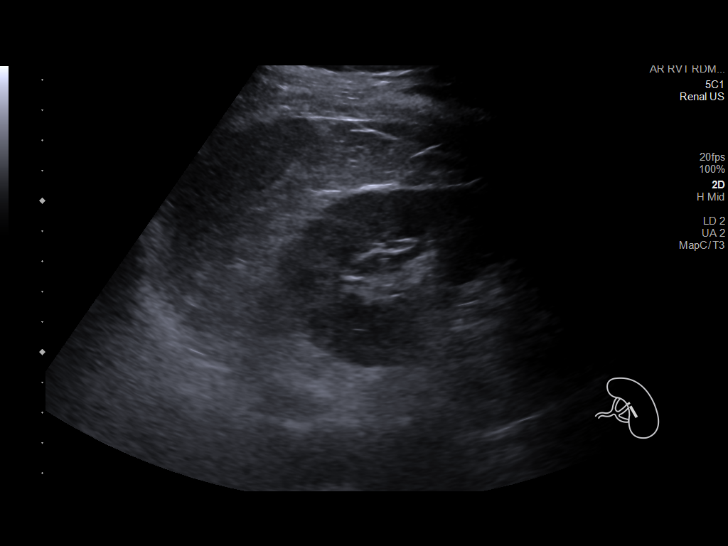
[im 24/45]
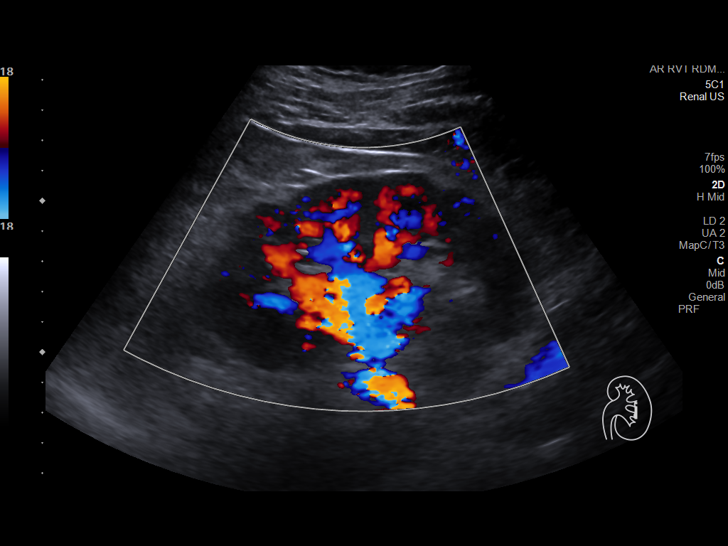
[im 28/45]
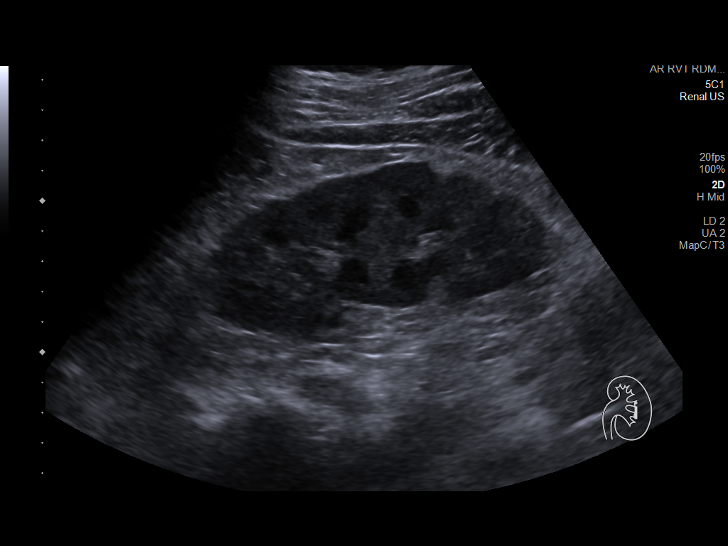
[im 30/45]
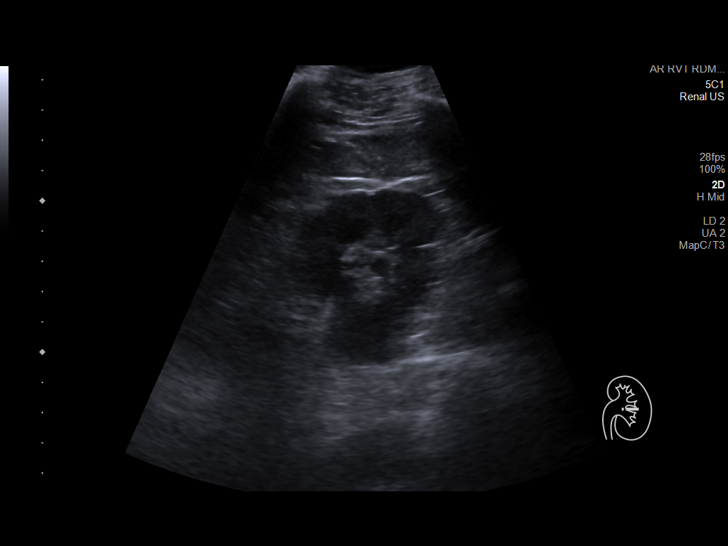
[im 34/45]
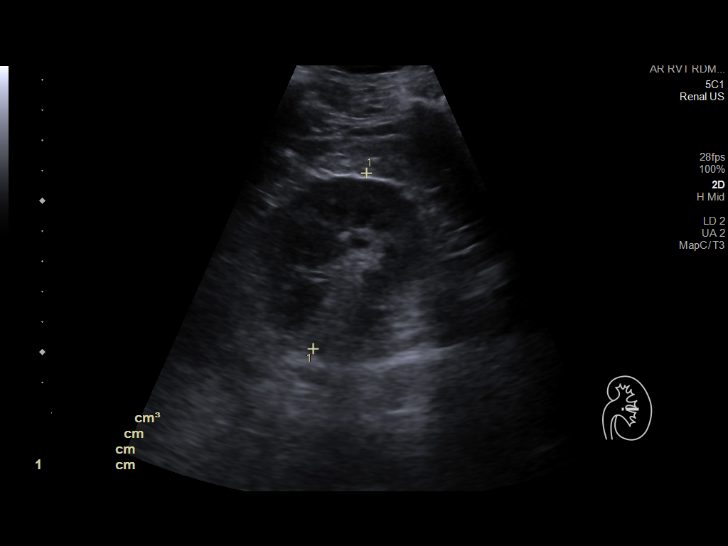
[im 37/45]
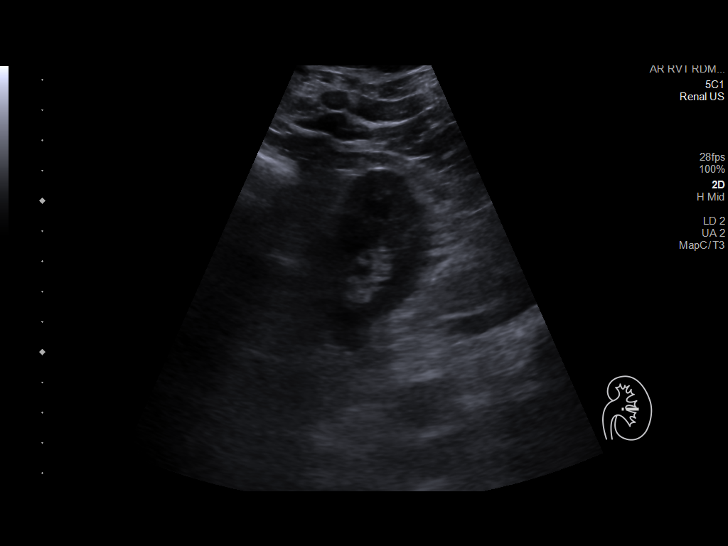
[im 41/45]
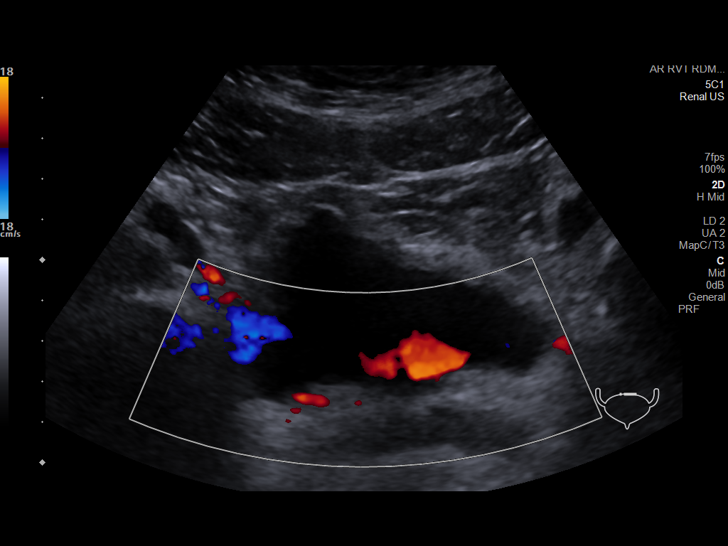
[im 45/45]
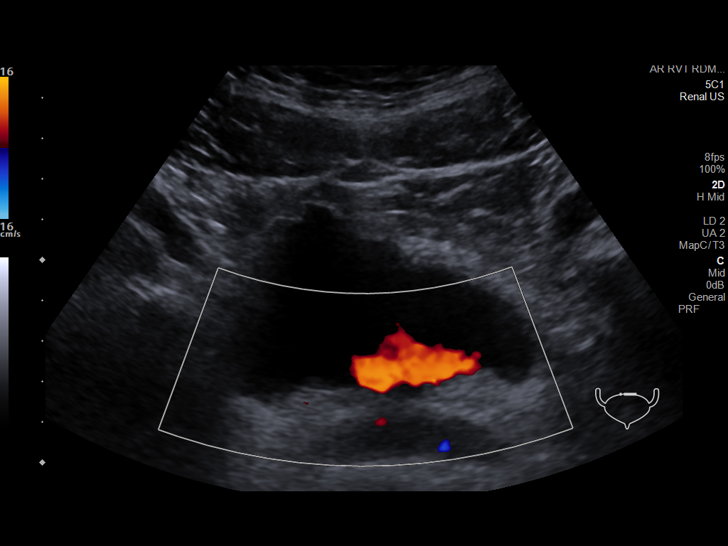

[14 of 25 positions shown; findings below may reference images not displayed]

FINDINGS: Right Kidney:

Renal measurements: 11.0 x 5.8 x 6.7 cm = volume: 21 mL.
Echogenicity within normal limits. No mass or hydronephrosis
visualized.

Left Kidney:

Renal measurements: 11.8 x 6.6 x 6.1 cm = volume: 246 mL.
Echogenicity within normal limits. No mass or hydronephrosis
visualized.

Bladder:

Mild thickening of the bladder wall likely due to under distension.
Chronic cystitis or obstruction can have a similar appearance.

Other:

Diffuse increased echogenicity of the visualized portions of the
hepatic parenchyma are a nonspecific indicator of hepatocellular
dysfunction, most commonly steatosis.
IMPRESSION: No significant sonographic abnormality of the kidneys.

## 2023-01-12 ENCOUNTER — Other Ambulatory Visit: Payer: Self-pay | Admitting: Family Medicine

## 2023-01-12 ENCOUNTER — Encounter (HOSPITAL_COMMUNITY): Payer: Self-pay | Admitting: Emergency Medicine

## 2023-01-12 ENCOUNTER — Emergency Department (HOSPITAL_COMMUNITY)
Admission: EM | Admit: 2023-01-12 | Discharge: 2023-01-12 | Disposition: A | Discharge status: Home / Self Care | Payer: 59 | Attending: Emergency Medicine | Admitting: Emergency Medicine

## 2023-01-12 ENCOUNTER — Other Ambulatory Visit: Payer: Self-pay

## 2023-01-12 ENCOUNTER — Emergency Department (HOSPITAL_COMMUNITY): Payer: 59

## 2023-01-12 DIAGNOSIS — Z79899 Other long term (current) drug therapy: Secondary | ICD-10-CM | POA: Diagnosis not present

## 2023-01-12 DIAGNOSIS — J301 Allergic rhinitis due to pollen: Secondary | ICD-10-CM

## 2023-01-12 DIAGNOSIS — R112 Nausea with vomiting, unspecified: Secondary | ICD-10-CM | POA: Insufficient documentation

## 2023-01-12 DIAGNOSIS — R42 Dizziness and giddiness: Secondary | ICD-10-CM | POA: Insufficient documentation

## 2023-01-12 DIAGNOSIS — R079 Chest pain, unspecified: Secondary | ICD-10-CM | POA: Diagnosis present

## 2023-01-12 DIAGNOSIS — I10 Essential (primary) hypertension: Secondary | ICD-10-CM | POA: Diagnosis not present

## 2023-01-12 LAB — CBC
HCT: 42 % (ref 39.0–52.0)
Hemoglobin: 14.9 g/dL (ref 13.0–17.0)
MCH: 30.2 pg (ref 26.0–34.0)
MCHC: 35.5 g/dL (ref 30.0–36.0)
MCV: 85 fL (ref 80.0–100.0)
Platelets: 301 10*3/uL (ref 150–400)
RBC: 4.94 MIL/uL (ref 4.22–5.81)
RDW: 13.2 % (ref 11.5–15.5)
WBC: 6.4 10*3/uL (ref 4.0–10.5)
nRBC: 0 % (ref 0.0–0.2)

## 2023-01-12 LAB — BASIC METABOLIC PANEL
Anion gap: 12 (ref 5–15)
BUN: 8 mg/dL (ref 6–20)
CO2: 22 mmol/L (ref 22–32)
Calcium: 9.1 mg/dL (ref 8.9–10.3)
Chloride: 103 mmol/L (ref 98–111)
Creatinine, Ser: 0.8 mg/dL (ref 0.61–1.24)
GFR, Estimated: >60 mL/min (ref >60)
Glucose, Bld: 118 mg/dL — ABNORMAL HIGH (ref 70–99)
Potassium: 3.8 mmol/L (ref 3.5–5.1)
Sodium: 137 mmol/L (ref 135–145)

## 2023-01-12 LAB — TROPONIN I (HIGH SENSITIVITY)
Troponin I (High Sensitivity): 4 ng/L (ref <18)
Troponin I (High Sensitivity): 4 ng/L (ref <18)

## 2023-01-12 MED ORDER — ONDANSETRON 4 MG PO TBDP
4.0000 mg | ORAL_TABLET | Freq: Once | ORAL | Status: AC
  Administered 2023-01-12: 4 mg via ORAL
  Filled 2023-01-12: qty 1

## 2023-01-12 NOTE — ED Notes (Signed)
Lab collecting 2nd trop at this time

## 2023-01-12 NOTE — ED Provider Notes (Signed)
James Snyder Provider Note   CSN: [id number] Arrival date & time: 01/12/23  1717     History  Chief Complaint  Patient presents with   Chest Pain    James Snyder is a 47 y.o. male.  47 year old male with a history of PSVT status post ablation, hypertension, and hyperlipidemia who presents emergency department with chest pain and dizziness.  Patient states that earlier tonight he was in the car.  Started feeling dizzy.  Has difficulty characterizing it as lightheaded or room spinning.  Says that he went and ate dinner and shortly afterwards had an episode of nonbloody nonbilious emesis.  Also started having some chest pressure.  Says that he typically can drink a soda and it will resolve but he tried that and did not have any relief.  Lasted for approximately 15 minutes in total.  No diaphoresis.  No radiation of the pain.  Not exertional or pleuritic.  Says that he has a strong family history of cardiac disease in his father died in his 51s from an MI.       Home Medications Prior to Admission medications   Medication Sig Start Date End Date Taking? Authorizing Provider  Aspirin-Acetaminophen-Caffeine (GOODY HEADACHE PO) Take 1 packet by mouth daily as needed (pain).    [provider]  baclofen (LIORESAL) 10 MG tablet Take 10 mg by mouth as needed for muscle spasms.    [provider]  cetirizine (ZYRTEC) 10 MG tablet TAKE 1 TABLET BY MOUTH EVERY DAY 01/13/23   Dettinger, Elige Radon, MD  dicyclomine (BENTYL) 10 MG capsule Take 1 capsule (10 mg total) by mouth 4 (four) times daily -  before meals and at bedtime. 11/07/22   Dettinger, Elige Radon, MD  enalapril (VASOTEC) 2.5 MG tablet Take 1 tablet (2.5 mg total) by mouth daily. 11/07/22   Dettinger, Elige Radon, MD  fluticasone (FLONASE) 50 MCG/ACT nasal spray Place 1 spray into both nostrils 2 (two) times daily. Patient taking differently: Place 1 spray into both nostrils 2  (two) times daily as needed for allergies. 03/14/21   Particia Nearing, PA-C  omeprazole (PRILOSEC) 40 MG capsule Take 1 capsule (40 mg total) by mouth daily. 11/07/22   Dettinger, Elige Radon, MD  pravastatin (PRAVACHOL) 40 MG tablet Take 1 tablet (40 mg total) by mouth daily. 11/07/22   Dettinger, Elige Radon, MD  sildenafil (VIAGRA) 100 MG tablet TAKE 1 TABLET (100 MG TOTAL) BY MOUTH DAILY AS NEEDED. 05/15/22   Dettinger, Elige Radon, MD  triamcinolone cream (KENALOG) 0.1 % Apply 1 Application topically 2 (two) times daily. 05/03/22   Raspet, Noberto Retort, PA-C  valACYclovir (VALTREX) 1000 MG tablet Take 1 tablet (1,000 mg total) by mouth daily. 11/07/22   Dettinger, Elige Radon, MD      Allergies    Penicillins    Review of Systems   Review of Systems  Physical Exam Updated Vital Signs BP (!) 149/89   Pulse 89   Temp 98 F (36.7 C)   Resp 18   Ht 5\' 9"  (1.753 m)   Wt 90 kg   SpO2 100%   BMI 29.30 kg/m  Physical Exam Vitals and nursing note reviewed.  Constitutional:      General: He is not in acute distress.    Appearance: He is well-developed.  HENT:     Head: Normocephalic and atraumatic.     Right Ear: External ear normal.     Left Ear:  External ear normal.     Nose: Nose normal.  Eyes:     Extraocular Movements: Extraocular movements intact.     Conjunctiva/sclera: Conjunctivae normal.     Pupils: Pupils are equal, round, and reactive to light.  Cardiovascular:     Rate and Rhythm: Normal rate and regular rhythm.     Heart sounds: Normal heart sounds.     Comments: Chest pain not reproducible.  No rashes. Pulmonary:     Effort: Pulmonary effort is normal. No respiratory distress.     Breath sounds: Normal breath sounds.  Musculoskeletal:     Cervical back: Normal range of motion and neck supple.     Right lower leg: No edema.     Left lower leg: No edema.  Skin:    General: Skin is warm and dry.  Neurological:     Mental Status: He is alert. Mental status is at baseline.   Psychiatric:        Mood and Affect: Mood normal.        Behavior: Behavior normal.     ED Results / Procedures / Treatments   Labs (all labs ordered are listed, but only abnormal results are displayed) Labs Reviewed  BASIC METABOLIC PANEL - Abnormal; Notable for the following components:      Result Value   Glucose, Bld 118 (*)    All other components within normal limits  CBC  TROPONIN I (HIGH SENSITIVITY)  TROPONIN I (HIGH SENSITIVITY)    EKG EKG Interpretation Date/Time:  Sunday January 12 2023 17:26:26 EST Ventricular Rate:  101 PR Interval:  128 QRS Duration:  74 QT Interval:  328 QTC Calculation: 425 R Axis:   72  Text Interpretation: Sinus tachycardia Nonspecific T wave abnormality Abnormal ECG When compared with ECG of 18-Nov-2018 12:51, ST no longer elevated in Lateral leads Nonspecific T wave abnormality now evident in Lateral leads Confirmed by Vonita Moss [phone]) on 01/12/2023 8:15:58 PM  Radiology DG Chest 2 View Result Date: 01/12/2023 CLINICAL DATA:  Chest pain, dizziness, vomiting EXAM: CHEST - 2 VIEW COMPARISON:  None Available. FINDINGS: The heart size and mediastinal contours are within normal limits. Both lungs are clear. The visualized skeletal structures are unremarkable. IMPRESSION: No active cardiopulmonary disease. Electronically Signed   By: Sharlet Salina M.D.   On: 01/12/2023 17:50    Procedures Procedures    Medications Ordered in ED Medications  ondansetron (ZOFRAN-ODT) disintegrating tablet 4 mg (4 mg Oral Given 01/12/23 2000)    ED Course/ Medical Decision Making/ A&P                                 Medical Decision Making Amount and/or Complexity of Data Reviewed Labs: ordered. Radiology: ordered.   James Snyder is a 47 y.o. male with comorbidities that complicate the patient evaluation including PSVT status post ablation, hypertension, and hyperlipidemia who presents emergency department with chest pain and  dizziness.   Initial Ddx:  MI, PE, pneumonia, dissection, pericarditis, costochondritis, reflux  MDM:  With the patient's chest discomfort will obtain EKG and troponins to evaluate for MI.  Does have family history of this and some risk factors as well.  Considered PE but patient does not have a history of cancer, hormone use, is not tachycardic or hypoxic so feel it is less likely.  Considered dissection but with their symmetric pulses, history, and description of the pain feel it is less  likely.  If chest x-ray reveals widened mediastinum or any other concerning findings will consider CTA.  No infectious symptoms to suggest pneumonia at this time that would be causing pleuritic chest pain.  If workup is reassuring could potentially reflect GERD.  Did have an episode of vomiting we will give him some Zofran as well.  Plan:  Labs Troponin EKG Chest x-ray Zofran  ED Summary/Re-evaluation:  EKG and serial troponins without signs of ischemia.  Chest x-ray without acute abnormalities such as widened mediastinum or infiltrate concerning for pneumonia.  Patient remained chest pain-free.  Able to tolerate p.o.  Given his symptoms could potentially be due to GERD especially since in the past that is resolved with drinking a beverage that is carbonated.  However with his risk factors we will have him follow-up with cardiology to see if he would benefit from stress test or other additional investigations.  This patient presents to the ED for concern of complaints listed in HPI, this involves an extensive number of treatment options, and is a complaint that carries with it a high risk of complications and morbidity. Disposition including potential need for admission considered.   Dispo: DC Home. Return precautions discussed including, but not limited to, those listed in the AVS. Allowed pt time to ask questions which were answered fully prior to dc.  Records reviewed Outpatient Clinic Notes The  following labs were independently interpreted: Serial Troponins and show no acute abnormality I independently reviewed the following imaging with scope of interpretation limited to determining acute life threatening conditions related to emergency care: Chest x-ray and agree with the radiologist interpretation with the following exceptions: none I personally reviewed and interpreted the pt's EKG: see above for interpretation  I have reviewed the patients home medications and made adjustments as needed   Final Clinical Impression(s) / ED Diagnoses Final diagnoses:  Nonspecific chest pain  Nausea and vomiting, unspecified vomiting type  Dizziness    Rx / DC Orders ED Discharge Orders          Ordered    Ambulatory referral to Cardiology        01/12/23 2027              Rondel Baton, MD 01/13/23 1245

## 2023-01-12 NOTE — ED Triage Notes (Signed)
Pt c/o chest pain with dizziness and vomited once.

## 2023-01-12 NOTE — ED Notes (Signed)
ED Provider at bedside. 

## 2023-01-12 NOTE — ED Provider Triage Note (Signed)
Emergency Medicine Provider Triage Evaluation Note  [name] , a 47 y.o. male  was evaluated in triage.  Pt complains of midsternal chest pain which started about 40 minutes prior to arrival.  He describes constant pain but is unable to describe the character, has nausea with 1 episode of emesis along with lightheadedness.  He was at rest when his symptoms began.  He recently had a suspected viral URI which he is recovering from.  Review of Systems  Positive: Chest pain, nausea, vomiting, lightheadedness, cough Negative: Abdominal pain, fever, shortness of breath  Physical Exam  BP (!) 158/99   Pulse 100   Temp 98.7 F (37.1 C) (Oral)   Resp 18   Ht 5\' 9"  (1.753 m)   Wt 90 kg   SpO2 97%   BMI 29.30 kg/m  Gen:   Awake, no distress   Resp:  Normal effort  MSK:   Moves extremities without difficulty  Other:    Medical Decision Making  Medically screening exam initiated at 6:13 PM.  Appropriate orders placed.  [name] was informed that the remainder of the evaluation will be completed by another provider, this initial triage assessment does not replace that evaluation, and the importance of remaining in the ED until their evaluation is complete.     Burgess Amor, PA-C 01/12/23 1815

## 2023-01-12 NOTE — Discharge Instructions (Signed)
You were seen for your chest pain in the emergency department.   At home, please take an acids and Tylenol for your pain.    Follow-up with your primary doctor in 2-3 days regarding your visit.  Cardiology will be calling you regarding an appointment within the next 72 hours.  You may contact them if you do not hear from them in that time using the information in this packet.  Return immediately to the emergency department if you experience any of the following: Worsening pain, difficulty breathing, unexplained vomiting or sweating, or any other concerning symptoms.    Thank you for visiting our Emergency Department. It was a pleasure taking care of you today.

## 2023-03-19 ENCOUNTER — Ambulatory Visit: Payer: 59 | Admitting: Cardiology

## 2023-04-22 ENCOUNTER — Other Ambulatory Visit: Payer: Self-pay | Admitting: Family Medicine

## 2023-04-22 MED ORDER — FLUTICASONE PROPIONATE 50 MCG/ACT NA SUSP: 1.0000 | Freq: Two times a day (BID) | NASAL | 0 refills | Status: AC

## 2023-06-25 ENCOUNTER — Ambulatory Visit: Admitting: Cardiology

## 2023-07-02 ENCOUNTER — Encounter: Payer: Self-pay | Admitting: Cardiology

## 2023-07-20 ENCOUNTER — Ambulatory Visit
Admission: EM | Admit: 2023-07-20 | Discharge: 2023-07-20 | Disposition: A | Discharge status: Home / Self Care | Attending: Nurse Practitioner | Admitting: Nurse Practitioner

## 2023-07-20 ENCOUNTER — Encounter: Payer: Self-pay | Admitting: Emergency Medicine

## 2023-07-20 DIAGNOSIS — L0291 Cutaneous abscess, unspecified: Secondary | ICD-10-CM

## 2023-07-20 DIAGNOSIS — S00462A Insect bite (nonvenomous) of left ear, initial encounter: Secondary | ICD-10-CM

## 2023-07-20 DIAGNOSIS — W57XXXA Bitten or stung by nonvenomous insect and other nonvenomous arthropods, initial encounter: Secondary | ICD-10-CM

## 2023-07-20 MED ORDER — DOXYCYCLINE HYCLATE 100 MG PO TABS: 100.0000 mg | ORAL_TABLET | Freq: Two times a day (BID) | ORAL | 0 refills | Status: AC

## 2023-07-20 NOTE — ED Triage Notes (Signed)
 Found tick on back of left ear last night.  Also noticed a boil on left leg (groin area)  x 4 days.

## 2023-07-20 NOTE — Discharge Instructions (Signed)
 Take medication as prescribed. You may take over-the-counter Tylenol  as needed for pain, fever, or general discomfort. Increase fluids and allow for plenty of rest. Apply warm compresses to the affected area at least 3-4 times daily while symptoms persist.  If the area opens, keep it covered if it begins to drain. Recommend cleaning the area twice daily with an antibacterial soap.  You can purchase over-the-counter Dial gold bar soap to clean the area.  Recommend using this type of soap when you see a possible skin abscess forming. Do not pop or disrupt the area while symptoms persist. With regard to your tick bite, monitor the area for a bull's-eye rash, or if you develop new symptoms such as fever, chills, severe fatigue, joint pain, joint swelling, headache, or other concerns.  If you develop any of the symptoms, please seek care immediately. You may follow-up in this clinic or with your primary care physician if symptoms fail to improve. Follow-up as needed.

## 2023-07-20 NOTE — ED Provider Notes (Signed)
 [facility]    CSN: [id number] Arrival date & time: 07/20/23  1057      History   Chief Complaint No chief complaint on file.   HPI FED CECI is a 48 y.o. male.   The history is provided by the patient.   Patient presents for complaints of a tick bite to the left ear and a skin abscess to the left groin.  Patient states he found the tick last evening, he is unsure of how long the tick was attached to his ear.  He states that the tick was engorged.  He also reports that he has been treating an abscess or boil to the left groin for the past 4 days.  He states that the area has decreased in size, states that it has not drained.  He denies fever, chills, headache, severe fatigue, joint pain, joint swelling, vomiting, diarrhea, or rash.  States that he has had some nausea over the past several days.  Patient denies prior history of diabetes.  Past Medical History:  Diagnosis Date   Allergy    Erectile dysfunction    GERD (gastroesophageal reflux disease)    Hyperlipidemia    Hypertension    SVT (supraventricular tachycardia) (HCC)     Patient Active Problem List   Diagnosis Date Noted   Mixed hyperlipidemia 01/01/2019   Seasonal allergic rhinitis due to pollen 01/01/2019   Pre-diabetes 02/29/2016   BMI 29.0-29.9,adult 12/24/2014   Erectile dysfunction 12/21/2014   Essential hypertension 04/28/2014   Acid reflux 04/28/2014   Seasonal allergies 04/28/2014    Past Surgical History:  Procedure Laterality Date   COLONOSCOPY WITH PROPOFOL  N/A 05/30/2021   Procedure: COLONOSCOPY WITH PROPOFOL ;  Surgeon: Shaaron Lamar HERO, MD;  Location: [facility];  Service: Endoscopy;  Laterality: N/A;  1:30pm   POLYPECTOMY  05/30/2021   Procedure: POLYPECTOMY;  Surgeon: Shaaron Lamar HERO, MD;  Location: [facility];  Service: Endoscopy;;   SVT ABLATION N/A 11/18/2018   Procedure: SVT ABLATION;  Surgeon: Kelsie Agent, MD;  Location: [facility];  Service: Cardiovascular;   Laterality: N/A;       Home Medications    Prior to Admission medications   Medication Sig Start Date End Date Taking? Authorizing Provider  Aspirin-Acetaminophen -Caffeine (GOODY HEADACHE PO) Take 1 packet by mouth daily as needed (pain).    [provider]  baclofen  (LIORESAL ) 10 MG tablet Take 10 mg by mouth as needed for muscle spasms.    [provider]  cetirizine  (ZYRTEC ) 10 MG tablet TAKE 1 TABLET BY MOUTH EVERY DAY 01/13/23   Dettinger, Fonda LABOR, MD  dicyclomine  (BENTYL ) 10 MG capsule Take 1 capsule (10 mg total) by mouth 4 (four) times daily -  before meals and at bedtime. 11/07/22   Dettinger, Fonda LABOR, MD  enalapril  (VASOTEC ) 2.5 MG tablet Take 1 tablet (2.5 mg total) by mouth daily. 11/07/22   Dettinger, Fonda LABOR, MD  fluticasone  (FLONASE ) 50 MCG/ACT nasal spray Place 1 spray into both nostrils 2 (two) times daily. 04/22/23   Dettinger, Fonda LABOR, MD  omeprazole  (PRILOSEC ) 40 MG capsule Take 1 capsule (40 mg total) by mouth daily. 11/07/22   Dettinger, Fonda LABOR, MD  pravastatin  (PRAVACHOL ) 40 MG tablet Take 1 tablet (40 mg total) by mouth daily. 11/07/22   Dettinger, Fonda LABOR, MD  sildenafil  (VIAGRA ) 100 MG tablet TAKE 1 TABLET (100 MG TOTAL) BY MOUTH DAILY AS NEEDED. 05/15/22   Dettinger, Fonda LABOR, MD  valACYclovir  (VALTREX )  1000 MG tablet Take 1 tablet (1,000 mg total) by mouth daily. 11/07/22   Dettinger, Fonda LABOR, MD    Family History Family History  Problem Relation Age of Onset   Cancer Mother    Heart disease Father    Stroke Brother    Colon cancer Neg Hx    Inflammatory bowel disease Neg Hx     Social History Social History   Tobacco Use   Smoking status: Former    Types: Cigars   Smokeless tobacco: Former   Tobacco comments:    every once in a while  Vaping Use   Vaping status: Never Used  Substance Use Topics   Alcohol use: No    Alcohol/week: 0.0 standard drinks of alcohol   Drug use: Never     Allergies    Penicillins   Review of Systems Review of Systems Per HPI  Physical Exam Triage Vital Signs ED Triage Vitals  Encounter Vitals Group     BP 07/20/23 1121 (!) 148/100     Girls Systolic BP Percentile --      Girls Diastolic BP Percentile --      Boys Systolic BP Percentile --      Boys Diastolic BP Percentile --      Pulse Rate 07/20/23 1121 (!) 109     Resp 07/20/23 1121 18     Temp 07/20/23 1121 98.7 F (37.1 C)     Temp Source 07/20/23 1121 Oral     SpO2 07/20/23 1121 91 %     Weight --      Height --      Head Circumference --      Peak Flow --      Pain Score 07/20/23 1122 3     Pain Loc --      Pain Education --      Exclude from Growth Chart --    No data found.  Updated Vital Signs BP (!) 148/100 (BP Location: Right Arm)   Pulse (!) 109   Temp 98.7 F (37.1 C) (Oral)   Resp 18   SpO2 91%   Visual Acuity Right Eye Distance:   Left Eye Distance:   Bilateral Distance:    Right Eye Near:   Left Eye Near:    Bilateral Near:     Physical Exam Vitals and nursing note reviewed.  Constitutional:      General: He is not in acute distress.    Appearance: Normal appearance.  HENT:     Head: Normocephalic.     Mouth/Throat:     Mouth: Mucous membranes are moist.     Pharynx: No posterior oropharyngeal erythema.  Eyes:     Extraocular Movements: Extraocular movements intact.     Pupils: Pupils are equal, round, and reactive to light.  Cardiovascular:     Rate and Rhythm: Regular rhythm. Tachycardia present.     Pulses: Normal pulses.     Heart sounds: Normal heart sounds.  Pulmonary:     Effort: Pulmonary effort is normal. No respiratory distress.     Breath sounds: Normal breath sounds. No stridor. No wheezing, rhonchi or rales.  Abdominal:     General: Bowel sounds are normal.     Palpations: Abdomen is soft.     Tenderness: There is no abdominal tenderness. There is no right CVA tenderness or left CVA tenderness.  Musculoskeletal:     Cervical  back: Normal range of motion.  Lymphadenopathy:     Cervical: No  cervical adenopathy.  Skin:    General: Skin is warm and dry.     Findings: Abscess present.      Neurological:     General: No focal deficit present.     Mental Status: He is alert and oriented to person, place, and time.  Psychiatric:        Mood and Affect: Mood normal.        Behavior: Behavior normal.      UC Treatments / Results  Labs (all labs ordered are listed, but only abnormal results are displayed) Labs Reviewed - No data to display  EKG   Radiology No results found.  Procedures Procedures (including critical care time)  Medications Ordered in UC Medications - No data to display  Initial Impression / Assessment and Plan / UC Course  I have reviewed the triage vital signs and the nursing notes.  Pertinent labs & imaging results that were available during my care of the patient were reviewed by me and considered in my medical decision making (see chart for details).  Patient with tick bite of the left ear and abscess to the left groin.  No rash or induration noted to the left ear.  He does have an abscess to the left groin, abscess is not amenable to I&D at this time.  Will treat with doxycycline  100 mg twice daily for the next 7 days.  This will also cover for prophylactic treatment for the tick bite.  Supportive care recommendations were provided and discussed with the patient to include over-the-counter analgesics, fluids, rest, and to monitor for signs of worsening.  Patient was given strict follow-up precautions.  Patient was in agreement with this plan of care and verbalizes understanding.  All questions were answered.  Patient stable for discharge.  Final Clinical Impressions(s) / UC Diagnoses   Final diagnoses:  None   Discharge Instructions   None    ED Prescriptions   None    PDMP not reviewed this encounter.   Gilmer Etta PARAS, NP 07/20/23 1140

## 2023-07-21 ENCOUNTER — Encounter: Payer: Self-pay | Admitting: Cardiology

## 2023-08-20 ENCOUNTER — Encounter: Payer: Self-pay | Admitting: Family Medicine

## 2023-08-20 ENCOUNTER — Ambulatory Visit (INDEPENDENT_AMBULATORY_CARE_PROVIDER_SITE_OTHER): Admitting: Family Medicine

## 2023-08-20 VITALS — BP 130/82 | HR 104 | Temp 97.2°F | Ht 69.0 in | Wt 204.0 lb

## 2023-08-20 DIAGNOSIS — R109 Unspecified abdominal pain: Secondary | ICD-10-CM | POA: Diagnosis not present

## 2023-08-20 DIAGNOSIS — R197 Diarrhea, unspecified: Secondary | ICD-10-CM | POA: Diagnosis not present

## 2023-08-20 DIAGNOSIS — Z202 Contact with and (suspected) exposure to infections with a predominantly sexual mode of transmission: Secondary | ICD-10-CM | POA: Diagnosis not present

## 2023-08-20 DIAGNOSIS — R112 Nausea with vomiting, unspecified: Secondary | ICD-10-CM | POA: Diagnosis not present

## 2023-08-20 MED ORDER — ONDANSETRON 8 MG PO TBDP: 8.0000 mg | ORAL_TABLET | Freq: Four times a day (QID) | ORAL | 1 refills | Status: AC | PRN

## 2023-08-20 MED ORDER — DIPHENOXYLATE-ATROPINE 2.5-0.025 MG PO TABS: 2.0000 | ORAL_TABLET | Freq: Four times a day (QID) | ORAL | 0 refills | Status: AC | PRN

## 2023-08-20 NOTE — Progress Notes (Signed)
 Subjective:  Patient ID: James Snyder, male    DOB: 1975/11/30  Age: 48 y.o. MRN: [id number]  CC: Exposure to STD   HPI James Snyder presents for testicular pain for one week. New sexual partner on July 29. No discharge, but has dysuria.  Since that contact he has loose BM after every time he eats. 3-4X a day.  He also reports intermittent nausea and vomiting especially 1 to 2 days after the contact.  No fever.      08/20/2023    3:53 PM 11/07/2022   10:48 AM 11/07/2022   10:47 AM  Depression screen PHQ 2/9  Decreased Interest 0  0  Down, Depressed, Hopeless 0  0  PHQ - 2 Score 0  0  Altered sleeping  0   Tired, decreased energy  0   Change in appetite  0   Feeling bad or failure about yourself   0   Trouble concentrating  0   Moving slowly or fidgety/restless  0   Suicidal thoughts  0   Difficult doing work/chores  Not difficult at all     History James Snyder has a past medical history of Allergy, Erectile dysfunction, GERD (gastroesophageal reflux disease), Hyperlipidemia, Hypertension, and SVT (supraventricular tachycardia) (HCC).   He has a past surgical history that includes SVT ABLATION (N/A, 11/18/2018); Colonoscopy with propofol  (N/A, 05/30/2021); and polypectomy (05/30/2021).   His family history includes Cancer in his mother; Heart disease in his father; Stroke in his brother.He reports that he has quit smoking. His smoking use included cigars. He has quit using smokeless tobacco. He reports that he does not drink alcohol and does not use drugs.    ROS Review of Systems  Constitutional:  Negative for fever.  Respiratory:  Negative for shortness of breath.   Cardiovascular:  Negative for chest pain.  Gastrointestinal:  Positive for diarrhea, nausea and vomiting.  Genitourinary:  Positive for testicular pain.  Musculoskeletal:  Negative for arthralgias.  Skin:  Negative for rash.    Objective:  BP 130/82   Pulse (!) 104   Temp (!) 97.2 F (36.2 C)   Ht 5'  9 (1.753 m)   Wt 204 lb (92.5 kg)   SpO2 97%   BMI 30.13 kg/m   BP Readings from Last 3 Encounters:  08/20/23 130/82  07/20/23 (!) 148/100  01/12/23 (!) 149/89    Wt Readings from Last 3 Encounters:  08/20/23 204 lb (92.5 kg)  01/12/23 198 lb 6.6 oz (90 kg)  11/07/22 200 lb (90.7 kg)     Physical Exam Vitals reviewed.  Constitutional:      General: He is not in acute distress.    Appearance: Normal appearance. He is well-developed. He is not ill-appearing.  HENT:     Head: Normocephalic and atraumatic.     Right Ear: External ear normal.     Left Ear: External ear normal.     Mouth/Throat:     Pharynx: No oropharyngeal exudate or posterior oropharyngeal erythema.  Eyes:     Pupils: Pupils are equal, round, and reactive to light.  Cardiovascular:     Rate and Rhythm: Normal rate and regular rhythm.     Heart sounds: Normal heart sounds. No murmur heard. Pulmonary:     Effort: No respiratory distress.     Breath sounds: Normal breath sounds.  Abdominal:     General: Abdomen is flat. There is no distension.     Tenderness: There is no abdominal tenderness.  There is no rebound.  Musculoskeletal:     Cervical back: Normal range of motion and neck supple.  Neurological:     Mental Status: He is alert and oriented to person, place, and time.      Assessment & Plan:  Possible exposure to STD -     Ct Ng M genitalium NAA, Urine -     Pro b natriuretic peptide (BNP) -     RPR -     HIV Antibody (routine testing w rflx) -     Hepatitis C antibody -     Urinalysis, Complete -     Urine Culture -     CBC with Differential/Platelet -     Urinalysis; Future  Diarrhea of presumed infectious origin -     Ct Ng M genitalium NAA, Urine -     Pro b natriuretic peptide (BNP) -     RPR -     HIV Antibody (routine testing w rflx) -     Hepatitis C antibody -     Urinalysis, Complete -     Urine Culture -     CBC with Differential/Platelet  Nausea and vomiting,  unspecified vomiting type -     Ct Ng M genitalium NAA, Urine -     Pro b natriuretic peptide (BNP) -     RPR -     HIV Antibody (routine testing w rflx) -     Hepatitis C antibody -     Urinalysis, Complete -     Urine Culture -     CBC with Differential/Platelet  Flank pain -     Ct Ng M genitalium NAA, Urine -     Pro b natriuretic peptide (BNP) -     RPR -     HIV Antibody (routine testing w rflx) -     Hepatitis C antibody -     Urinalysis, Complete -     Urine Culture -     CBC with Differential/Platelet  Other orders -     Ondansetron ; Take 1 tablet (8 mg total) by mouth every 6 (six) hours as needed for nausea or vomiting.  Dispense: 20 tablet; Refill: 1 -     Diphenoxylate -Atropine ; Take 2 tablets by mouth 4 (four) times daily as needed for diarrhea or loose stools.  Dispense: 30 tablet; Refill: 0     Follow-up: No follow-ups on file.  Butler Der, M.D.

## 2023-08-21 LAB — URINALYSIS
Bilirubin, UA: NEGATIVE
Glucose, UA: NEGATIVE
Ketones, UA: NEGATIVE
Leukocytes,UA: NEGATIVE
Nitrite, UA: NEGATIVE
Specific Gravity, UA: 1.015 (ref 1.005–1.030)
Urobilinogen, Ur: 0.2 mg/dL (ref 0.2–1.0)
pH, UA: 6.5 (ref 5.0–7.5)

## 2023-08-21 LAB — CBC WITH DIFFERENTIAL/PLATELET
Basophils Absolute: 0 x10E3/uL (ref 0.0–0.2)
Basos: 1 %
EOS (ABSOLUTE): 0 x10E3/uL (ref 0.0–0.4)
Eos: 0 %
Hematocrit: 44.1 % (ref 37.5–51.0)
Hemoglobin: 14.6 g/dL (ref 13.0–17.7)
Immature Grans (Abs): 0 x10E3/uL (ref 0.0–0.1)
Immature Granulocytes: 0 %
Lymphocytes Absolute: 2.4 x10E3/uL (ref 0.7–3.1)
Lymphs: 41 %
MCH: 29.8 pg (ref 26.6–33.0)
MCHC: 33.1 g/dL (ref 31.5–35.7)
MCV: 90 fL (ref 79–97)
Monocytes Absolute: 0.8 x10E3/uL (ref 0.1–0.9)
Monocytes: 13 %
Neutrophils Absolute: 2.6 x10E3/uL (ref 1.4–7.0)
Neutrophils: 44 %
Platelets: 291 x10E3/uL (ref 150–450)
RBC: 4.9 x10E6/uL (ref 4.14–5.80)
RDW: 15.6 % — ABNORMAL HIGH (ref 11.6–15.4)
WBC: 5.7 x10E3/uL (ref 3.4–10.8)

## 2023-08-21 LAB — CT NG M GENITALIUM NAA, URINE
Chlamydia trachomatis, NAA: NEGATIVE
Mycoplasma genitalium NAA: NEGATIVE
Neisseria gonorrhoeae, NAA: NEGATIVE

## 2023-08-21 LAB — HIV ANTIBODY (ROUTINE TESTING W REFLEX): HIV Screen 4th Generation wRfx: NONREACTIVE

## 2023-08-21 LAB — HEPATITIS C ANTIBODY: Hep C Virus Ab: NONREACTIVE

## 2023-08-21 LAB — PRO B NATRIURETIC PEPTIDE: NT-Pro BNP: <36 pg/mL (ref 0–121)

## 2023-08-21 LAB — RPR: RPR Ser Ql: NONREACTIVE

## 2023-08-22 ENCOUNTER — Telehealth: Payer: Self-pay | Admitting: Family Medicine

## 2023-08-22 LAB — URINE CULTURE: Organism ID, Bacteria: NO GROWTH

## 2023-08-22 NOTE — Telephone Encounter (Signed)
 Patient was seen by Dr Zollie on 8/6 and lab work was completed that day. Can covering provider review these results? Patient says he goes back to work [employer] and is wanting his results explained to him because he's still having symptoms.

## 2023-08-22 NOTE — Telephone Encounter (Signed)
 Dr. Zollie will review all labs next week. But from what I cansee all test are negative and CBC and idney function are al normal. Urine god also.

## 2023-08-22 NOTE — Telephone Encounter (Signed)
 I called and left message making patient aware.

## 2023-08-25 ENCOUNTER — Telehealth: Payer: Self-pay | Admitting: Family Medicine

## 2023-08-25 ENCOUNTER — Ambulatory Visit: Payer: Self-pay | Admitting: Family Medicine

## 2023-08-25 NOTE — Telephone Encounter (Signed)
 Copied from CRM [id number]. Topic: Clinical - Lab/Test Results >> Aug 25, 2023  3:27 PM DeAngela L wrote: Reason for CRM: Patient would like a call back from the provider about his lab results and the patient work overnights so he would be easier to speak with in the morning hours about his lab results, patient says he is still having symptoms and would verbally like to speak about the lab results  Pt num [phone] (M)

## 2023-08-25 NOTE — Telephone Encounter (Signed)
 Please review results.

## 2023-08-26 ENCOUNTER — Other Ambulatory Visit: Payer: Self-pay | Admitting: Family Medicine

## 2023-08-26 MED ORDER — DOXYCYCLINE HYCLATE 100 MG PO CAPS: 100.0000 mg | ORAL_CAPSULE | Freq: Two times a day (BID) | ORAL | 0 refills | Status: DC

## 2023-08-27 ENCOUNTER — Ambulatory Visit: Admitting: Family Medicine

## 2023-11-07 ENCOUNTER — Ambulatory Visit: Payer: PRIVATE HEALTH INSURANCE | Admitting: Family Medicine

## 2023-11-07 ENCOUNTER — Encounter: Payer: Self-pay | Admitting: Family Medicine

## 2023-11-07 VITALS — BP 125/79 | HR 102 | Ht 69.0 in | Wt 212.0 lb

## 2023-11-07 DIAGNOSIS — R7303 Prediabetes: Secondary | ICD-10-CM

## 2023-11-07 DIAGNOSIS — Z23 Encounter for immunization: Secondary | ICD-10-CM | POA: Diagnosis not present

## 2023-11-07 DIAGNOSIS — Z0001 Encounter for general adult medical examination with abnormal findings: Secondary | ICD-10-CM | POA: Diagnosis not present

## 2023-11-07 DIAGNOSIS — Z0000 Encounter for general adult medical examination without abnormal findings: Secondary | ICD-10-CM

## 2023-11-07 DIAGNOSIS — E782 Mixed hyperlipidemia: Secondary | ICD-10-CM

## 2023-11-07 DIAGNOSIS — K219 Gastro-esophageal reflux disease without esophagitis: Secondary | ICD-10-CM | POA: Diagnosis not present

## 2023-11-07 DIAGNOSIS — I10 Essential (primary) hypertension: Secondary | ICD-10-CM

## 2023-11-07 DIAGNOSIS — K582 Mixed irritable bowel syndrome: Secondary | ICD-10-CM | POA: Diagnosis not present

## 2023-11-07 LAB — LIPID PANEL

## 2023-11-07 LAB — BAYER DCA HB A1C WAIVED: HB A1C (BAYER DCA - WAIVED): 6 % — ABNORMAL HIGH (ref 4.8–5.6)

## 2023-11-07 MED ORDER — DICYCLOMINE HCL 10 MG PO CAPS: 10.0000 mg | ORAL_CAPSULE | Freq: Three times a day (TID) | ORAL | 3 refills | Status: AC

## 2023-11-07 MED ORDER — PRAVASTATIN SODIUM 40 MG PO TABS: 40.0000 mg | ORAL_TABLET | Freq: Every day | ORAL | 3 refills | Status: AC

## 2023-11-07 MED ORDER — OMEPRAZOLE 40 MG PO CPDR: 40.0000 mg | DELAYED_RELEASE_CAPSULE | Freq: Every day | ORAL | 3 refills | Status: AC

## 2023-11-07 MED ORDER — VALACYCLOVIR HCL 1 G PO TABS: 1000.0000 mg | ORAL_TABLET | Freq: Every day | ORAL | 3 refills | Status: AC

## 2023-11-07 MED ORDER — ENALAPRIL MALEATE 2.5 MG PO TABS: 2.5000 mg | ORAL_TABLET | Freq: Every day | ORAL | 3 refills | Status: AC

## 2023-11-07 NOTE — Progress Notes (Signed)
 BP 125/79   Pulse (!) 102   Ht 5' 9 (1.753 m)   Wt 212 lb (96.2 kg)   SpO2 92%   BMI 31.31 kg/m    Subjective:   Patient ID: James Snyder, male    DOB: 01-28-1975, 48 y.o.   MRN: [id number]  HPI: James Snyder is a 48 y.o. male presenting on 11/07/2023 for Medical Management of Chronic Issues (CPE)   Discussed the use of AI scribe software for clinical note transcription with the patient, who gave verbal consent to proceed.  History of Present Illness   James Snyder is a 48 year old male who presents for an annual physical exam.  General health status - No current health concerns - No issues with current medications - No new symptoms since last visit  Hypertension - Takes enalapril  2.5 mg daily - Does not monitor blood pressure at home due to busy work schedule - Denies symptoms related to hypertension - Feels well overall  Hyperlipidemia - Takes daily medication for hyperlipidemia - No adverse effects from medication - No current symptoms related to hyperlipidemia  Gastroesophageal reflux disease (gerd) - Takes omeprazole  daily - No current symptoms of reflux - No medication side effects  Fatigue - Feels tired due to 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 Interim medical history since our last visit reviewed. Allergies and medications reviewed and updated.  Review of Systems  Constitutional:  Negative for chills and fever.  HENT:  Negative for ear pain and tinnitus.   Eyes:  Negative for pain.  Respiratory:  Negative for cough,  shortness of breath and wheezing.   Cardiovascular:  Negative for chest pain, palpitations and leg swelling.  Gastrointestinal:  Negative for abdominal pain, blood in stool, constipation and diarrhea.  Genitourinary:  Negative for dysuria and hematuria.  Musculoskeletal:  Negative for back pain and myalgias.  Skin:  Negative for rash.  Neurological:  Negative for dizziness, weakness and headaches.  Psychiatric/Behavioral:  Negative for suicidal ideas.     Per HPI unless specifically indicated above   Allergies as of 11/07/2023       Reactions   Penicillins Hives   Did it involve swelling of the face/tongue/throat, SOB, or low BP? No Did it involve sudden or severe rash/hives, skin peeling, or any reaction on the inside of your mouth or nose? No Did you need to seek medical attention at a hospital or doctor's office? No When did it last happen?      Childhood If all above answers are "NO", may proceed with cephalosporin use.        Medication List        Accurate as of November 07, 2023 11:19 AM. If you have any questions, ask your nurse or doctor.          STOP taking these medications    doxycycline  100 MG capsule Commonly known as: Vibramycin  Stopped by: Fonda LABOR Tammie Yanda       TAKE these medications    baclofen  10 MG tablet Commonly known as: LIORESAL  Take 10 mg by  mouth as needed for muscle spasms.   cetirizine  10 MG tablet Commonly known as: ZYRTEC  TAKE 1 TABLET BY MOUTH EVERY DAY   dicyclomine  10 MG capsule Commonly known as: BENTYL  Take 1 capsule (10 mg total) by mouth 4 (four) times daily -  before meals and at bedtime.   diphenoxylate -atropine  2.5-0.025 MG tablet Commonly known as: Lomotil  Take 2 tablets by mouth 4 (four) times daily as needed for diarrhea or loose stools.   enalapril  2.5 MG tablet Commonly known as: VASOTEC  Take 1 tablet (2.5 mg total) by mouth daily.   fluticasone  50 MCG/ACT nasal spray Commonly known as: FLONASE  Place 1  spray into both nostrils 2 (two) times daily.   GOODY HEADACHE PO Take 1 packet by mouth daily as needed (pain).   omeprazole  40 MG capsule Commonly known as: PRILOSEC  Take 1 capsule (40 mg total) by mouth daily.   ondansetron  8 MG disintegrating tablet Commonly known as: ZOFRAN -ODT Take 1 tablet (8 mg total) by mouth every 6 (six) hours as needed for nausea or vomiting.   pravastatin  40 MG tablet Commonly known as: PRAVACHOL  Take 1 tablet (40 mg total) by mouth daily.   sildenafil  100 MG tablet Commonly known as: VIAGRA  TAKE 1 TABLET (100 MG TOTAL) BY MOUTH DAILY AS NEEDED.   valACYclovir  1000 MG tablet Commonly known as: VALTREX  Take 1 tablet (1,000 mg total) by mouth daily.         Objective:   BP 125/79   Pulse (!) 102   Ht 5' 9 (1.753 m)   Wt 212 lb (96.2 kg)   SpO2 92%   BMI 31.31 kg/m   Wt Readings from Last 3 Encounters:  11/07/23 212 lb (96.2 kg)  08/20/23 204 lb (92.5 kg)  01/12/23 198 lb 6.6 oz (90 kg)    Physical Exam Physical Exam   VITALS: BP- 128/79 HEENT: Ears normal. NECK: Throat normal. CHEST: Lungs clear to auscultation bilaterally. CARDIOVASCULAR: Heart regular rate and rhythm. ABDOMEN: Abdomen soft, non-tender, no masses. No costovertebral angle tenderness. EXTREMITIES: Reflexes normal, pulses normal, no edema.         Assessment & Plan:   Problem List Items Addressed This Visit       Cardiovascular and Mediastinum   Essential hypertension   Relevant Medications   enalapril  (VASOTEC ) 2.5 MG tablet   pravastatin  (PRAVACHOL ) 40 MG tablet   Other Relevant Orders   CBC with Differential/Platelet   CMP14+EGFR   Lipid panel   Bayer DCA Hb A1c Waived     Digestive   Acid reflux   Relevant Medications   dicyclomine  (BENTYL ) 10 MG capsule   omeprazole  (PRILOSEC ) 40 MG capsule   Other Relevant Orders   CBC with Differential/Platelet   CMP14+EGFR   Lipid panel   Bayer DCA Hb A1c Waived     Other   Pre-diabetes   Relevant  Orders   CBC with Differential/Platelet   CMP14+EGFR   Lipid panel   Bayer DCA Hb A1c Waived   Mixed hyperlipidemia   Relevant Medications   enalapril  (VASOTEC ) 2.5 MG tablet   pravastatin  (PRAVACHOL ) 40 MG tablet   Other Relevant Orders   CBC with Differential/Platelet   CMP14+EGFR   Lipid panel   Bayer DCA Hb A1c Waived   Other Visit Diagnoses       Physical exam    -  Primary   Relevant Orders   CBC with Differential/Platelet   CMP14+EGFR   Lipid panel   Bayer DCA Hb A1c  Waived     Irritable bowel syndrome with both constipation and diarrhea       Relevant Medications   dicyclomine  (BENTYL ) 10 MG capsule   omeprazole  (PRILOSEC ) 40 MG capsule          Adult Wellness Visit Routine annual exam with no acute concerns. Blood pressure controlled. No new symptoms. - Perform blood work: kidney, liver, cholesterol panels. - Review colonoscopy results for next screening interval.  Essential hypertension Blood pressure controlled at 128/79 mmHg on enalapril  2.5 mg daily. No home monitoring due to work schedule. - Continue enalapril  2.5 mg daily. - Encourage monthly home blood pressure monitoring.  Mixed hyperlipidemia Cholesterol medication taken daily. Blood work needed to assess levels. - Perform blood work to assess cholesterol levels.  Gastroesophageal reflux disease GERD symptoms well-managed with omeprazole . No new issues. - Continue omeprazole  as prescribed.          Follow up plan: Return in about 1 year (around 11/06/2024), or if symptoms worsen or fail to improve, for Physical exam.  Counseling provided for all of the vaccine components Orders Placed This Encounter  Procedures   CBC with Differential/Platelet   CMP14+EGFR   Lipid panel   Bayer DCA Hb A1c Waived    Fonda Levins, MD [facility] 11/07/2023, 11:19 AM

## 2023-11-08 LAB — CMP14+EGFR
ALT: 37 IU/L (ref 0–44)
AST: 27 IU/L (ref 0–40)
Albumin: 4.4 g/dL (ref 4.1–5.1)
Alkaline Phosphatase: 65 IU/L (ref 47–123)
BUN/Creatinine Ratio: 6 — ABNORMAL LOW (ref 9–20)
BUN: 5 mg/dL — ABNORMAL LOW (ref 6–24)
Bilirubin Total: 0.6 mg/dL (ref 0.0–1.2)
CO2: 21 mmol/L (ref 20–29)
Calcium: 9.4 mg/dL (ref 8.7–10.2)
Chloride: 101 mmol/L (ref 96–106)
Creatinine, Ser: 0.79 mg/dL (ref 0.76–1.27)
Globulin, Total: 2.7 g/dL (ref 1.5–4.5)
Glucose: 112 mg/dL — ABNORMAL HIGH (ref 70–99)
Potassium: 4.3 mmol/L (ref 3.5–5.2)
Sodium: 137 mmol/L (ref 134–144)
Total Protein: 7.1 g/dL (ref 6.0–8.5)
eGFR: 109 mL/min/1.73 (ref >59)

## 2023-11-08 LAB — CBC WITH DIFFERENTIAL/PLATELET
Basophils Absolute: 0 x10E3/uL (ref 0.0–0.2)
Basos: 0 %
EOS (ABSOLUTE): 0 x10E3/uL (ref 0.0–0.4)
Eos: 1 %
Hematocrit: 44.6 % (ref 37.5–51.0)
Hemoglobin: 14.6 g/dL (ref 13.0–17.7)
Immature Grans (Abs): 0 x10E3/uL (ref 0.0–0.1)
Immature Granulocytes: 0 %
Lymphocytes Absolute: 2.7 x10E3/uL (ref 0.7–3.1)
Lymphs: 44 %
MCH: 29.1 pg (ref 26.6–33.0)
MCHC: 32.7 g/dL (ref 31.5–35.7)
MCV: 89 fL (ref 79–97)
Monocytes Absolute: 0.6 x10E3/uL (ref 0.1–0.9)
Monocytes: 10 %
Neutrophils Absolute: 2.7 x10E3/uL (ref 1.4–7.0)
Neutrophils: 44 %
Platelets: 306 x10E3/uL (ref 150–450)
RBC: 5.01 x10E6/uL (ref 4.14–5.80)
RDW: 15.8 % — ABNORMAL HIGH (ref 11.6–15.4)
WBC: 6.1 x10E3/uL (ref 3.4–10.8)

## 2023-11-08 LAB — LIPID PANEL
Chol/HDL Ratio: 5.1 ratio — ABNORMAL HIGH (ref 0.0–5.0)
Cholesterol, Total: 203 mg/dL — ABNORMAL HIGH (ref 100–199)
HDL: 40 mg/dL (ref >39)
LDL Chol Calc (NIH): 107 mg/dL — ABNORMAL HIGH (ref 0–99)
Triglycerides: 328 mg/dL — ABNORMAL HIGH (ref 0–149)
VLDL Cholesterol Cal: 56 mg/dL — ABNORMAL HIGH (ref 5–40)

## 2023-11-16 ENCOUNTER — Other Ambulatory Visit: Payer: Self-pay

## 2023-11-16 ENCOUNTER — Encounter (HOSPITAL_COMMUNITY): Payer: Self-pay | Admitting: Emergency Medicine

## 2023-11-16 ENCOUNTER — Emergency Department (HOSPITAL_COMMUNITY): Payer: Worker's Compensation

## 2023-11-16 ENCOUNTER — Emergency Department (HOSPITAL_COMMUNITY)
Admission: EM | Admit: 2023-11-16 | Discharge: 2023-11-16 | Disposition: A | Discharge status: Home / Self Care | Payer: Worker's Compensation | Attending: Emergency Medicine | Admitting: Emergency Medicine

## 2023-11-16 DIAGNOSIS — Z7982 Long term (current) use of aspirin: Secondary | ICD-10-CM | POA: Diagnosis not present

## 2023-11-16 DIAGNOSIS — W19XXXA Unspecified fall, initial encounter: Secondary | ICD-10-CM | POA: Diagnosis not present

## 2023-11-16 DIAGNOSIS — Y990 Civilian activity done for income or pay: Secondary | ICD-10-CM | POA: Insufficient documentation

## 2023-11-16 DIAGNOSIS — S63502A Unspecified sprain of left wrist, initial encounter: Secondary | ICD-10-CM | POA: Insufficient documentation

## 2023-11-16 DIAGNOSIS — M25532 Pain in left wrist: Secondary | ICD-10-CM | POA: Diagnosis present

## 2023-11-16 MED ORDER — IBUPROFEN 800 MG PO TABS: 800.0000 mg | ORAL_TABLET | Freq: Four times a day (QID) | ORAL | 0 refills | Status: AC | PRN

## 2023-11-16 NOTE — ED Provider Notes (Signed)
 [place] EMERGENCY DEPARTMENT AT [facility] Provider Note   CSN: [id number] Arrival date & time: 11/16/23  [id number]     Patient presents with: Wrist Injury   James Snyder is a 48 y.o. male.   Presents to the emergency department for evaluation of left wrist injury.  Patient had a fall tonight while at work.  Patient reports throbbing pain on the radial aspect of the wrist.  He took ibuprofen before coming and it has helped somewhat.  No other injuries.       Prior to Admission medications   Medication Sig Start Date End Date Taking? Authorizing Provider  Aspirin-Acetaminophen -Caffeine (GOODY HEADACHE PO) Take 1 packet by mouth daily as needed (pain).    [provider]  baclofen  (LIORESAL ) 10 MG tablet Take 10 mg by mouth as needed for muscle spasms.    [provider]  cetirizine  (ZYRTEC ) 10 MG tablet TAKE 1 TABLET BY MOUTH EVERY DAY 01/13/23   Dettinger, Fonda LABOR, MD  dicyclomine  (BENTYL ) 10 MG capsule Take 1 capsule (10 mg total) by mouth 4 (four) times daily -  before meals and at bedtime. 11/07/23   Dettinger, Fonda LABOR, MD  diphenoxylate -atropine  (LOMOTIL ) 2.5-0.025 MG tablet Take 2 tablets by mouth 4 (four) times daily as needed for diarrhea or loose stools. 08/20/23   Zollie Lowers, MD  enalapril  (VASOTEC ) 2.5 MG tablet Take 1 tablet (2.5 mg total) by mouth daily. 11/07/23   Dettinger, Fonda LABOR, MD  fluticasone  (FLONASE ) 50 MCG/ACT nasal spray Place 1 spray into both nostrils 2 (two) times daily. 04/22/23   Dettinger, Fonda LABOR, MD  omeprazole  (PRILOSEC ) 40 MG capsule Take 1 capsule (40 mg total) by mouth daily. 11/07/23   Dettinger, Fonda LABOR, MD  ondansetron  (ZOFRAN -ODT) 8 MG disintegrating tablet Take 1 tablet (8 mg total) by mouth every 6 (six) hours as needed for nausea or vomiting. 08/20/23   Zollie Lowers, MD  pravastatin  (PRAVACHOL ) 40 MG tablet Take 1 tablet (40 mg total) by mouth daily. 11/07/23   Dettinger, Fonda LABOR, MD  sildenafil  (VIAGRA ) 100  MG tablet TAKE 1 TABLET (100 MG TOTAL) BY MOUTH DAILY AS NEEDED. 05/15/22   Dettinger, Fonda LABOR, MD  valACYclovir  (VALTREX ) 1000 MG tablet Take 1 tablet (1,000 mg total) by mouth daily. 11/07/23   Dettinger, Fonda LABOR, MD    Allergies: Penicillins    Review of Systems  Updated Vital Signs BP (!) 161/94   Pulse (!) 106   Temp 98.5 F (36.9 C) (Oral)   Resp 16   Ht 5' 9 (1.753 m)   Wt 96 kg   SpO2 95%   BMI 31.25 kg/m   Physical Exam Vitals and nursing note reviewed.  Constitutional:      Appearance: Normal appearance.  HENT:     Head: Atraumatic.  Musculoskeletal:     Left wrist: Swelling, tenderness and snuff box tenderness present. No deformity or crepitus. Decreased range of motion. Normal pulse.  Skin:    Findings: No signs of injury or laceration.  Neurological:     Mental Status: He is alert.     Cranial Nerves: Cranial nerves 2-12 are intact.     Sensory: Sensation is intact.     Motor: Motor function is intact.     (all labs ordered are listed, but only abnormal results are displayed) Labs Reviewed - No data to display  EKG: None  Radiology: DG Wrist Complete Left Result Date: 11/16/2023 CLINICAL DATA:  Left wrist injury and  pain. EXAM: LEFT WRIST - COMPLETE 3+ VIEW COMPARISON:  None Available. FINDINGS: There is no evidence of fracture or dislocation. 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 IMPRESSION: No acute findings. 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 Electronically Signed   By: Norleen DELENA Kil M.D.   On: 11/16/2023 04:55     Procedures   Medications Ordered in the ED - No data to display                                  Medical Decision Making  Patient with isolated left wrist injury after a fall.  X-ray shows evidence of old nonunited scaphoid fracture with associated arthritis but no new injury.  Patient does have snuffbox area tenderness.  Will place him in a  cock up wrist splint and have follow-up with orthopedics either through [employer] or with on-call hand surgery.     Final diagnoses:  Wrist sprain, left, initial encounter    ED Discharge Orders     None          Dina Mobley, Lonni PARAS, MD 11/16/23 [phone]

## 2023-11-16 NOTE — ED Triage Notes (Signed)
 Pt with c/o L wrist pain while trying to apprehend a suspect. States wrist is throbbing and swelling. Took 2 Ibuprofen PTA.

## 2023-11-17 ENCOUNTER — Ambulatory Visit: Payer: Self-pay | Admitting: Family Medicine
